# Patient Record
Sex: Female | Born: 1955 | Race: White | Hispanic: No | Marital: Married | Smoking: Current every day smoker
Health system: Southern US, Community
[De-identification: ages and names within clinical notes are randomized; demographics above are authoritative.]

## PROBLEM LIST (undated history)

## (undated) DIAGNOSIS — E119 Type 2 diabetes mellitus without complications: Secondary | ICD-10-CM

## (undated) DIAGNOSIS — I509 Heart failure, unspecified: Secondary | ICD-10-CM

## (undated) DIAGNOSIS — J449 Chronic obstructive pulmonary disease, unspecified: Secondary | ICD-10-CM

## (undated) DIAGNOSIS — I10 Essential (primary) hypertension: Secondary | ICD-10-CM

## (undated) HISTORY — PX: ABDOMINAL HYSTERECTOMY: SHX81

## (undated) HISTORY — PX: THROAT SURGERY: SHX803

---

## 2018-08-31 ENCOUNTER — Inpatient Hospital Stay
Admission: EM | Admit: 2018-08-31 | Discharge: 2018-09-24 | DRG: 207 | Disposition: E | Payer: Self-pay | Attending: Internal Medicine | Admitting: Internal Medicine

## 2018-08-31 DIAGNOSIS — R402432 Glasgow coma scale score 3-8, at arrival to emergency department: Secondary | ICD-10-CM | POA: Diagnosis present

## 2018-08-31 DIAGNOSIS — R092 Respiratory arrest: Secondary | ICD-10-CM

## 2018-08-31 DIAGNOSIS — Z8249 Family history of ischemic heart disease and other diseases of the circulatory system: Secondary | ICD-10-CM

## 2018-08-31 DIAGNOSIS — J9690 Respiratory failure, unspecified, unspecified whether with hypoxia or hypercapnia: Secondary | ICD-10-CM

## 2018-08-31 DIAGNOSIS — E669 Obesity, unspecified: Secondary | ICD-10-CM | POA: Diagnosis present

## 2018-08-31 DIAGNOSIS — Z880 Allergy status to penicillin: Secondary | ICD-10-CM

## 2018-08-31 DIAGNOSIS — E875 Hyperkalemia: Secondary | ICD-10-CM | POA: Diagnosis present

## 2018-08-31 DIAGNOSIS — I110 Hypertensive heart disease with heart failure: Secondary | ICD-10-CM | POA: Diagnosis present

## 2018-08-31 DIAGNOSIS — Z833 Family history of diabetes mellitus: Secondary | ICD-10-CM

## 2018-08-31 DIAGNOSIS — Z515 Encounter for palliative care: Secondary | ICD-10-CM | POA: Diagnosis not present

## 2018-08-31 DIAGNOSIS — D696 Thrombocytopenia, unspecified: Secondary | ICD-10-CM | POA: Diagnosis present

## 2018-08-31 DIAGNOSIS — N289 Disorder of kidney and ureter, unspecified: Secondary | ICD-10-CM | POA: Diagnosis present

## 2018-08-31 DIAGNOSIS — G931 Anoxic brain damage, not elsewhere classified: Secondary | ICD-10-CM | POA: Diagnosis present

## 2018-08-31 DIAGNOSIS — I428 Other cardiomyopathies: Secondary | ICD-10-CM | POA: Diagnosis present

## 2018-08-31 DIAGNOSIS — Z853 Personal history of malignant neoplasm of breast: Secondary | ICD-10-CM

## 2018-08-31 DIAGNOSIS — I469 Cardiac arrest, cause unspecified: Secondary | ICD-10-CM | POA: Diagnosis present

## 2018-08-31 DIAGNOSIS — G936 Cerebral edema: Secondary | ICD-10-CM | POA: Diagnosis present

## 2018-08-31 DIAGNOSIS — Z0189 Encounter for other specified special examinations: Secondary | ICD-10-CM

## 2018-08-31 DIAGNOSIS — Z8241 Family history of sudden cardiac death: Secondary | ICD-10-CM

## 2018-08-31 DIAGNOSIS — J690 Pneumonitis due to inhalation of food and vomit: Secondary | ICD-10-CM | POA: Diagnosis present

## 2018-08-31 DIAGNOSIS — R197 Diarrhea, unspecified: Secondary | ICD-10-CM | POA: Diagnosis present

## 2018-08-31 DIAGNOSIS — A419 Sepsis, unspecified organism: Secondary | ICD-10-CM | POA: Diagnosis present

## 2018-08-31 DIAGNOSIS — Z9911 Dependence on respirator [ventilator] status: Secondary | ICD-10-CM

## 2018-08-31 DIAGNOSIS — I2510 Atherosclerotic heart disease of native coronary artery without angina pectoris: Secondary | ICD-10-CM | POA: Diagnosis present

## 2018-08-31 DIAGNOSIS — E785 Hyperlipidemia, unspecified: Secondary | ICD-10-CM | POA: Diagnosis present

## 2018-08-31 DIAGNOSIS — J441 Chronic obstructive pulmonary disease with (acute) exacerbation: Secondary | ICD-10-CM | POA: Diagnosis present

## 2018-08-31 DIAGNOSIS — G253 Myoclonus: Secondary | ICD-10-CM | POA: Diagnosis not present

## 2018-08-31 DIAGNOSIS — R6521 Severe sepsis with septic shock: Secondary | ICD-10-CM | POA: Diagnosis present

## 2018-08-31 DIAGNOSIS — Z7984 Long term (current) use of oral hypoglycemic drugs: Secondary | ICD-10-CM

## 2018-08-31 DIAGNOSIS — J9601 Acute respiratory failure with hypoxia: Principal | ICD-10-CM | POA: Diagnosis present

## 2018-08-31 DIAGNOSIS — Z7982 Long term (current) use of aspirin: Secondary | ICD-10-CM

## 2018-08-31 DIAGNOSIS — R6520 Severe sepsis without septic shock: Secondary | ICD-10-CM | POA: Diagnosis present

## 2018-08-31 DIAGNOSIS — J9602 Acute respiratory failure with hypercapnia: Secondary | ICD-10-CM | POA: Diagnosis present

## 2018-08-31 DIAGNOSIS — J189 Pneumonia, unspecified organism: Secondary | ICD-10-CM

## 2018-08-31 DIAGNOSIS — F17210 Nicotine dependence, cigarettes, uncomplicated: Secondary | ICD-10-CM | POA: Diagnosis present

## 2018-08-31 DIAGNOSIS — Z6841 Body Mass Index (BMI) 40.0 and over, adult: Secondary | ICD-10-CM

## 2018-08-31 DIAGNOSIS — E1165 Type 2 diabetes mellitus with hyperglycemia: Secondary | ICD-10-CM | POA: Diagnosis present

## 2018-08-31 DIAGNOSIS — E119 Type 2 diabetes mellitus without complications: Secondary | ICD-10-CM

## 2018-08-31 DIAGNOSIS — I10 Essential (primary) hypertension: Secondary | ICD-10-CM | POA: Diagnosis present

## 2018-08-31 DIAGNOSIS — E781 Pure hyperglyceridemia: Secondary | ICD-10-CM | POA: Diagnosis present

## 2018-08-31 DIAGNOSIS — R570 Cardiogenic shock: Secondary | ICD-10-CM | POA: Diagnosis present

## 2018-08-31 DIAGNOSIS — Z66 Do not resuscitate: Secondary | ICD-10-CM | POA: Diagnosis not present

## 2018-08-31 HISTORY — DX: Chronic obstructive pulmonary disease, unspecified: J44.9

## 2018-08-31 HISTORY — DX: Heart failure, unspecified: I50.9

## 2018-08-31 HISTORY — DX: Type 2 diabetes mellitus without complications: E11.9

## 2018-08-31 HISTORY — DX: Essential (primary) hypertension: I10

## 2018-08-31 MED ORDER — ETOMIDATE 2 MG/ML IV SOLN
30.0000 mg | Freq: Once | INTRAVENOUS | Status: AC
  Administered 2018-09-01: 30 mg via INTRAVENOUS

## 2018-08-31 MED ORDER — ROCURONIUM BROMIDE 50 MG/5ML IV SOLN
120.0000 mg | Freq: Once | INTRAVENOUS | Status: AC
  Administered 2018-09-01: 120 mg via INTRAVENOUS
  Filled 2018-08-31: qty 12

## 2018-08-31 NOTE — ED Triage Notes (Addendum)
Patient coming [facility] from home. Call initially for respiratory distress. Upon EMS arrival they were informed that patient was assisted to floor after losing consciousness - patient unresponsive at EMS arrival.   Initial rhythm asystole. Patient given 2 epi's and 20 minutes of CPR. ROSC returned.   Patient's BP intinially 40/20. Dopamine initiated. Following BPs were 90/50, and 150/114.  [facility] airway on arrival

## 2018-09-01 ENCOUNTER — Inpatient Hospital Stay (HOSPITAL_COMMUNITY)
Admit: 2018-09-01 | Discharge: 2018-09-01 | Disposition: A | Discharge status: Home / Self Care | Payer: Self-pay | Attending: Adult Health | Admitting: Adult Health

## 2018-09-01 ENCOUNTER — Emergency Department: Payer: Self-pay

## 2018-09-01 ENCOUNTER — Inpatient Hospital Stay: Payer: Self-pay

## 2018-09-01 ENCOUNTER — Other Ambulatory Visit: Payer: Self-pay

## 2018-09-01 DIAGNOSIS — I469 Cardiac arrest, cause unspecified: Secondary | ICD-10-CM | POA: Diagnosis present

## 2018-09-01 DIAGNOSIS — A419 Sepsis, unspecified organism: Secondary | ICD-10-CM | POA: Diagnosis present

## 2018-09-01 DIAGNOSIS — I10 Essential (primary) hypertension: Secondary | ICD-10-CM | POA: Diagnosis present

## 2018-09-01 DIAGNOSIS — J9602 Acute respiratory failure with hypercapnia: Secondary | ICD-10-CM

## 2018-09-01 DIAGNOSIS — J441 Chronic obstructive pulmonary disease with (acute) exacerbation: Secondary | ICD-10-CM | POA: Diagnosis present

## 2018-09-01 DIAGNOSIS — E119 Type 2 diabetes mellitus without complications: Secondary | ICD-10-CM

## 2018-09-01 DIAGNOSIS — J9601 Acute respiratory failure with hypoxia: Secondary | ICD-10-CM | POA: Diagnosis present

## 2018-09-01 DIAGNOSIS — R6520 Severe sepsis without septic shock: Secondary | ICD-10-CM

## 2018-09-01 DIAGNOSIS — J690 Pneumonitis due to inhalation of food and vomit: Secondary | ICD-10-CM | POA: Diagnosis present

## 2018-09-01 LAB — GASTROINTESTINAL PANEL BY PCR, STOOL (REPLACES STOOL CULTURE)

## 2018-09-01 LAB — CBC WITH DIFFERENTIAL/PLATELET
Abs Immature Granulocytes: 1.05 10*3/uL — ABNORMAL HIGH (ref 0.00–0.07)
Basophils Absolute: 0.1 10*3/uL (ref 0.0–0.1)
Basophils Relative: 1 %
Eosinophils Absolute: 0.3 10*3/uL (ref 0.0–0.5)
Eosinophils Relative: 2 %
HCT: 46.9 % — ABNORMAL HIGH (ref 36.0–46.0)
Hemoglobin: 14.8 g/dL (ref 12.0–15.0)
Immature Granulocytes: 6 %
Lymphocytes Relative: 52 %
Lymphs Abs: 9.2 10*3/uL — ABNORMAL HIGH (ref 0.7–4.0)
MCH: 32 pg (ref 26.0–34.0)
MCHC: 31.6 g/dL (ref 30.0–36.0)
MCV: 101.5 fL — ABNORMAL HIGH (ref 80.0–100.0)
Monocytes Absolute: 0.6 10*3/uL (ref 0.1–1.0)
Monocytes Relative: 3 %
NEUTROS ABS: 6.3 10*3/uL (ref 1.7–7.7)
Neutrophils Relative %: 36 %
Platelets: 234 10*3/uL (ref 150–400)
RBC: 4.62 MIL/uL (ref 3.87–5.11)
RDW: 12.1 % (ref 11.5–15.5)
SMEAR REVIEW: UNDETERMINED
WBC: 17.5 10*3/uL — AB (ref 4.0–10.5)
nRBC: 0.2 % (ref 0.0–0.2)

## 2018-09-01 LAB — BLOOD GAS, ARTERIAL
Acid-base deficit: 4.8 mmol/L — ABNORMAL HIGH (ref 0.0–2.0)
Acid-base deficit: 7.4 mmol/L — ABNORMAL HIGH (ref 0.0–2.0)
Acid-base deficit: 7.4 mmol/L — ABNORMAL HIGH (ref 0.0–2.0)
BICARBONATE: 24 mmol/L (ref 20.0–28.0)
BICARBONATE: 20.5 mmol/L (ref 20.0–28.0)
Bicarbonate: 21.9 mmol/L (ref 20.0–28.0)
FIO2: 0.5
FIO2: 0.4
FIO2: 0.3
LHR: 22 {breaths}/min
MECHVT: 550 mL
MECHVT: 550 mL
MECHVT: 450 mL
Mechanical Rate: 16
Mechanical Rate: 22
O2 SAT: 88 %
O2 Saturation: 99.4 %
O2 Saturation: 93.2 %
PEEP/CPAP: 5 cmH2O
PEEP: 5 cmH2O
PEEP: 5 cmH2O
Patient temperature: 37
Patient temperature: 34.4
Patient temperature: 34
RATE: 16 resp/min
pCO2 arterial: 60 mmHg — ABNORMAL HIGH (ref 32.0–48.0)
pCO2 arterial: 60 mmHg — ABNORMAL HIGH (ref 32.0–48.0)
pCO2 arterial: 50 mmHg — ABNORMAL HIGH (ref 32.0–48.0)
pH, Arterial: 7.21 — ABNORMAL LOW (ref 7.350–7.450)
pH, Arterial: 7.2 — ABNORMAL LOW (ref 7.350–7.450)
pH, Arterial: 7.26 — ABNORMAL LOW (ref 7.350–7.450)
pO2, Arterial: 179 mmHg — ABNORMAL HIGH (ref 83.0–108.0)
pO2, Arterial: 72 mmHg — ABNORMAL LOW (ref 83.0–108.0)
pO2, Arterial: 54 mmHg — ABNORMAL LOW (ref 83.0–108.0)

## 2018-09-01 LAB — HEMOGLOBIN A1C
Hgb A1c MFr Bld: 7.1 % — ABNORMAL HIGH (ref 4.8–5.6)
Mean Plasma Glucose: 157.07 mg/dL

## 2018-09-01 LAB — BASIC METABOLIC PANEL
Anion gap: 5 (ref 5–15)
BUN: 16 mg/dL (ref 6–20)
CO2: 24 mmol/L (ref 22–32)
Calcium: 8.6 mg/dL — ABNORMAL LOW (ref 8.9–10.3)
Chloride: 109 mmol/L (ref 98–111)
Creatinine, Ser: 0.93 mg/dL (ref 0.44–1.00)
GFR calc Af Amer: >60 mL/min (ref >60)
GFR calc non Af Amer: >60 mL/min (ref >60)
Glucose, Bld: 399 mg/dL — ABNORMAL HIGH (ref 70–99)
Potassium: 5.2 mmol/L — ABNORMAL HIGH (ref 3.5–5.1)
Sodium: 138 mmol/L (ref 135–145)

## 2018-09-01 LAB — TROPONIN I
Troponin I: 0.09 ng/mL (ref <0.03)
Troponin I: 0.41 ng/mL (ref <0.03)
Troponin I: 0.41 ng/mL (ref <0.03)
Troponin I: 0.45 ng/mL (ref <0.03)

## 2018-09-01 LAB — URINALYSIS, COMPLETE (UACMP) WITH MICROSCOPIC
Bilirubin Urine: NEGATIVE
Glucose, UA: >=500 mg/dL — AB
Ketones, ur: NEGATIVE mg/dL
Leukocytes,Ua: NEGATIVE
NITRITE: NEGATIVE
Specific Gravity, Urine: 1.015 (ref 1.005–1.030)
pH: 6 (ref 5.0–8.0)

## 2018-09-01 LAB — BASIC METABOLIC PANEL WITH GFR
Anion gap: 7 (ref 5–15)
BUN: 21 mg/dL — ABNORMAL HIGH (ref 6–20)
CO2: 20 mmol/L — ABNORMAL LOW (ref 22–32)
Calcium: 8.4 mg/dL — ABNORMAL LOW (ref 8.9–10.3)
Chloride: 117 mmol/L — ABNORMAL HIGH (ref 98–111)
Creatinine, Ser: 0.67 mg/dL (ref 0.44–1.00)
GFR calc Af Amer: >60 mL/min (ref >60)
GFR calc non Af Amer: >60 mL/min (ref >60)
Glucose, Bld: 147 mg/dL — ABNORMAL HIGH (ref 70–99)
Potassium: 3.6 mmol/L (ref 3.5–5.1)
Sodium: 144 mmol/L (ref 135–145)

## 2018-09-01 LAB — URINALYSIS, ROUTINE W REFLEX MICROSCOPIC
Bacteria, UA: NONE SEEN
Bilirubin Urine: NEGATIVE
Glucose, UA: NEGATIVE mg/dL
Ketones, ur: NEGATIVE mg/dL
Leukocytes,Ua: NEGATIVE
Nitrite: NEGATIVE
Protein, ur: 30 mg/dL — AB
Specific Gravity, Urine: 1.034 — ABNORMAL HIGH (ref 1.005–1.030)
pH: 5 (ref 5.0–8.0)

## 2018-09-01 LAB — URINE DRUG SCREEN, QUALITATIVE (ARMC ONLY)
Amphetamines, Ur Screen: NOT DETECTED
Barbiturates, Ur Screen: NOT DETECTED
Benzodiazepine, Ur Scrn: NOT DETECTED
Cannabinoid 50 Ng, Ur ~~LOC~~: NOT DETECTED
Cocaine Metabolite,Ur ~~LOC~~: NOT DETECTED
MDMA (ECSTASY) UR SCREEN: NOT DETECTED
Methadone Scn, Ur: NOT DETECTED
Opiate, Ur Screen: NOT DETECTED
Phencyclidine (PCP) Ur S: NOT DETECTED
Tricyclic, Ur Screen: NOT DETECTED

## 2018-09-01 LAB — COMPREHENSIVE METABOLIC PANEL
ALBUMIN: 3.7 g/dL (ref 3.5–5.0)
ALT: 84 U/L — ABNORMAL HIGH (ref 0–44)
ANION GAP: 15 (ref 5–15)
AST: 135 U/L — ABNORMAL HIGH (ref 15–41)
Alkaline Phosphatase: 79 U/L (ref 38–126)
BUN: 11 mg/dL (ref 6–20)
CO2: 18 mmol/L — ABNORMAL LOW (ref 22–32)
Calcium: 8.7 mg/dL — ABNORMAL LOW (ref 8.9–10.3)
Chloride: 108 mmol/L (ref 98–111)
Creatinine, Ser: 1.11 mg/dL — ABNORMAL HIGH (ref 0.44–1.00)
GFR calc Af Amer: >60 mL/min (ref >60)
GFR calc non Af Amer: 54 mL/min — ABNORMAL LOW (ref >60)
GLUCOSE: 307 mg/dL — AB (ref 70–99)
POTASSIUM: 5.7 mmol/L — AB (ref 3.5–5.1)
Sodium: 141 mmol/L (ref 135–145)
Total Bilirubin: 0.6 mg/dL (ref 0.3–1.2)
Total Protein: 6.1 g/dL — ABNORMAL LOW (ref 6.5–8.1)

## 2018-09-01 LAB — GLUCOSE, CAPILLARY
GLUCOSE-CAPILLARY: 276 mg/dL — AB (ref 70–99)
GLUCOSE-CAPILLARY: 147 mg/dL — AB (ref 70–99)
Glucose-Capillary: 284 mg/dL — ABNORMAL HIGH (ref 70–99)
Glucose-Capillary: 381 mg/dL — ABNORMAL HIGH (ref 70–99)
Glucose-Capillary: 367 mg/dL — ABNORMAL HIGH (ref 70–99)
Glucose-Capillary: 318 mg/dL — ABNORMAL HIGH (ref 70–99)
Glucose-Capillary: 246 mg/dL — ABNORMAL HIGH (ref 70–99)
Glucose-Capillary: 209 mg/dL — ABNORMAL HIGH (ref 70–99)
Glucose-Capillary: 175 mg/dL — ABNORMAL HIGH (ref 70–99)
Glucose-Capillary: 159 mg/dL — ABNORMAL HIGH (ref 70–99)
Glucose-Capillary: 138 mg/dL — ABNORMAL HIGH (ref 70–99)
Glucose-Capillary: 133 mg/dL — ABNORMAL HIGH (ref 70–99)
Glucose-Capillary: 137 mg/dL — ABNORMAL HIGH (ref 70–99)
Glucose-Capillary: 127 mg/dL — ABNORMAL HIGH (ref 70–99)
Glucose-Capillary: 159 mg/dL — ABNORMAL HIGH (ref 70–99)
Glucose-Capillary: 150 mg/dL — ABNORMAL HIGH (ref 70–99)
Glucose-Capillary: 158 mg/dL — ABNORMAL HIGH (ref 70–99)

## 2018-09-01 LAB — MAGNESIUM
MAGNESIUM: 1.6 mg/dL — AB (ref 1.7–2.4)
Magnesium: 2.2 mg/dL (ref 1.7–2.4)

## 2018-09-01 LAB — CBC
HCT: 41.9 % (ref 36.0–46.0)
Hemoglobin: 13.9 g/dL (ref 12.0–15.0)
MCH: 32.5 pg (ref 26.0–34.0)
MCHC: 33.2 g/dL (ref 30.0–36.0)
MCV: 97.9 fL (ref 80.0–100.0)
Platelets: 243 K/uL (ref 150–400)
RBC: 4.28 MIL/uL (ref 3.87–5.11)
RDW: 12.3 % (ref 11.5–15.5)
WBC: 39.5 K/uL — ABNORMAL HIGH (ref 4.0–10.5)
nRBC: 0.1 % (ref 0.0–0.2)

## 2018-09-01 LAB — PROCALCITONIN: Procalcitonin: 5.84 ng/mL

## 2018-09-01 LAB — ECHOCARDIOGRAM COMPLETE
Height: 62 in
Weight: 3470.92 oz

## 2018-09-01 LAB — INFLUENZA PANEL BY PCR (TYPE A & B)
Influenza A By PCR: NEGATIVE
Influenza B By PCR: NEGATIVE

## 2018-09-01 LAB — BLOOD GAS, VENOUS
FIO2: 0.6
MECHVT: 550 mL
PEEP/CPAP: 5 cmH2O
PO2 VEN: 130 mmHg — AB (ref 32.0–45.0)
Patient temperature: 37
RATE: 10 resp/min
pCO2, Ven: 119 mmHg (ref 44.0–60.0)
pH, Ven: <6.9 — CL (ref 7.250–7.430)

## 2018-09-01 LAB — APTT: aPTT: 37 seconds — ABNORMAL HIGH (ref 24–36)

## 2018-09-01 LAB — LACTIC ACID, PLASMA
Lactic Acid, Venous: 2.3 mmol/L (ref 0.5–1.9)
Lactic Acid, Venous: 6.6 mmol/L (ref 0.5–1.9)
Lactic Acid, Venous: >11 mmol/L (ref 0.5–1.9)

## 2018-09-01 LAB — PROTIME-INR
INR: 1.2 (ref 0.8–1.2)
Prothrombin Time: 15.3 s — ABNORMAL HIGH (ref 11.4–15.2)

## 2018-09-01 LAB — PHOSPHORUS
Phosphorus: 5.4 mg/dL — ABNORMAL HIGH (ref 2.5–4.6)
Phosphorus: 3.2 mg/dL (ref 2.5–4.6)

## 2018-09-01 LAB — C DIFFICILE QUICK SCREEN W PCR REFLEX
C Diff antigen: NEGATIVE
C Diff interpretation: NOT DETECTED
C Diff toxin: NEGATIVE

## 2018-09-01 LAB — MRSA PCR SCREENING: MRSA by PCR: NEGATIVE

## 2018-09-01 MED ORDER — SODIUM CHLORIDE 0.9 % IV SOLN
1.0000 g | Freq: Once | INTRAVENOUS | Status: AC
  Administered 2018-09-01: 1 g via INTRAVENOUS
  Filled 2018-09-01: qty 1

## 2018-09-01 MED ORDER — IPRATROPIUM-ALBUTEROL 0.5-2.5 (3) MG/3ML IN SOLN: 3.0000 mL | RESPIRATORY_TRACT | Status: DC | PRN

## 2018-09-01 MED ORDER — SODIUM CHLORIDE 0.9 % IV SOLN
INTRAVENOUS | Status: DC
  Administered 2018-09-01 (×2): via INTRAVENOUS

## 2018-09-01 MED ORDER — ONDANSETRON HCL 4 MG/2ML IJ SOLN: 4.0000 mg | Freq: Four times a day (QID) | INTRAMUSCULAR | Status: DC | PRN

## 2018-09-01 MED ORDER — ASPIRIN 300 MG RE SUPP: 300.0000 mg | RECTAL | Status: DC

## 2018-09-01 MED ORDER — MIDAZOLAM 50MG/50ML (1MG/ML) PREMIX INFUSION
0.0000 mg/h | INTRAVENOUS | Status: DC
  Filled 2018-09-01: qty 50

## 2018-09-01 MED ORDER — ORAL CARE MOUTH RINSE
15.0000 mL | OROMUCOSAL | Status: DC
  Administered 2018-09-01 – 2018-09-05 (×37): 15 mL via OROMUCOSAL

## 2018-09-01 MED ORDER — DEXTROSE 50 % IV SOLN
INTRAVENOUS | Status: AC
  Administered 2018-09-01: 25 mL via INTRAVENOUS
  Filled 2018-09-01: qty 50

## 2018-09-01 MED ORDER — VANCOMYCIN HCL IN DEXTROSE 1-5 GM/200ML-% IV SOLN
1000.0000 mg | INTRAVENOUS | Status: DC
  Administered 2018-09-01: 1000 mg via INTRAVENOUS
  Filled 2018-09-01: qty 200

## 2018-09-01 MED ORDER — FENTANYL 2500MCG IN NS 250ML (10MCG/ML) PREMIX INFUSION
25.0000 ug/h | INTRAVENOUS | Status: DC
  Administered 2018-09-01: 50 ug/h via INTRAVENOUS
  Administered 2018-09-02: 125 ug/h via INTRAVENOUS
  Administered 2018-09-02 – 2018-09-03 (×2): 25 ug/h via INTRAVENOUS
  Filled 2018-09-01 (×4): qty 250

## 2018-09-01 MED ORDER — BUDESONIDE 0.5 MG/2ML IN SUSP
0.5000 mg | Freq: Two times a day (BID) | RESPIRATORY_TRACT | Status: DC
  Administered 2018-09-01 – 2018-09-03 (×5): 0.5 mg via RESPIRATORY_TRACT
  Filled 2018-09-01 (×5): qty 2

## 2018-09-01 MED ORDER — ACETAMINOPHEN 325 MG PO TABS: 650.0000 mg | ORAL_TABLET | ORAL | Status: DC | PRN

## 2018-09-01 MED ORDER — NOREPINEPHRINE 16 MG/250ML-% IV SOLN
0.0000 ug/min | INTRAVENOUS | Status: DC
  Administered 2018-09-01: 5 ug/min via INTRAVENOUS
  Filled 2018-09-01: qty 250

## 2018-09-01 MED ORDER — SODIUM CHLORIDE 0.9 % IV BOLUS
1000.0000 mL | Freq: Once | INTRAVENOUS | Status: AC
  Administered 2018-09-01: 1000 mL via INTRAVENOUS

## 2018-09-01 MED ORDER — METHYLPREDNISOLONE SODIUM SUCC 125 MG IJ SOLR
125.0000 mg | Freq: Once | INTRAMUSCULAR | Status: AC
  Administered 2018-09-01: 125 mg via INTRAVENOUS
  Filled 2018-09-01: qty 2

## 2018-09-01 MED ORDER — CALCIUM CHLORIDE 10 % IV SOLN
1.0000 g | Freq: Once | INTRAVENOUS | Status: AC
  Administered 2018-09-01: 1 g via INTRAVENOUS

## 2018-09-01 MED ORDER — METRONIDAZOLE IN NACL 5-0.79 MG/ML-% IV SOLN
500.0000 mg | Freq: Three times a day (TID) | INTRAVENOUS | Status: DC
  Administered 2018-09-01 – 2018-09-03 (×8): 500 mg via INTRAVENOUS
  Filled 2018-09-01 (×11): qty 100

## 2018-09-01 MED ORDER — MIDAZOLAM BOLUS VIA INFUSION
2.0000 mg | INTRAVENOUS | Status: DC | PRN
  Administered 2018-09-01: 4 mg via INTRAVENOUS
  Filled 2018-09-01: qty 4

## 2018-09-01 MED ORDER — ORAL CARE MOUTH RINSE
15.0000 mL | OROMUCOSAL | Status: DC
  Administered 2018-09-01: 15 mL via OROMUCOSAL

## 2018-09-01 MED ORDER — DOPAMINE-DEXTROSE 3.2-5 MG/ML-% IV SOLN
INTRAVENOUS | Status: AC
  Filled 2018-09-01: qty 250

## 2018-09-01 MED ORDER — FUROSEMIDE 10 MG/ML IJ SOLN
20.0000 mg | Freq: Once | INTRAMUSCULAR | Status: AC
  Administered 2018-09-01: 20 mg via INTRAVENOUS
  Filled 2018-09-01: qty 4

## 2018-09-01 MED ORDER — INSULIN ASPART 100 UNIT/ML ~~LOC~~ SOLN
10.0000 [IU] | Freq: Once | SUBCUTANEOUS | Status: AC
  Administered 2018-09-01: 10 [IU] via INTRAVENOUS
  Filled 2018-09-01: qty 1

## 2018-09-01 MED ORDER — HEPARIN BOLUS VIA INFUSION
4000.0000 [IU] | Freq: Once | INTRAVENOUS | Status: AC
  Administered 2018-09-01: 4000 [IU] via INTRAVENOUS
  Filled 2018-09-01: qty 4000

## 2018-09-01 MED ORDER — CLINDAMYCIN PHOSPHATE 600 MG/50ML IV SOLN
600.0000 mg | Freq: Once | INTRAVENOUS | Status: AC
  Administered 2018-09-01: 600 mg via INTRAVENOUS
  Filled 2018-09-01: qty 50

## 2018-09-01 MED ORDER — DEXTROSE 50 % IV SOLN
25.0000 mL | Freq: Once | INTRAVENOUS | Status: AC
  Administered 2018-09-01 (×2): 25 mL via INTRAVENOUS
  Filled 2018-09-01: qty 50

## 2018-09-01 MED ORDER — METHYLPREDNISOLONE SODIUM SUCC 40 MG IJ SOLR
40.0000 mg | Freq: Two times a day (BID) | INTRAMUSCULAR | Status: DC
  Administered 2018-09-01 – 2018-09-02 (×2): 40 mg via INTRAVENOUS
  Filled 2018-09-01 (×2): qty 1

## 2018-09-01 MED ORDER — ARTIFICIAL TEARS OPHTHALMIC OINT
TOPICAL_OINTMENT | OPHTHALMIC | Status: DC | PRN
  Administered 2018-09-01: 1 via OPHTHALMIC
  Administered 2018-09-03 – 2018-09-04 (×3): via OPHTHALMIC
  Filled 2018-09-01 (×2): qty 3.5

## 2018-09-01 MED ORDER — SODIUM CHLORIDE 0.9 % IV SOLN
0.0000 mg/h | INTRAVENOUS | Status: DC
  Administered 2018-09-01: 2 mg/h via INTRAVENOUS
  Administered 2018-09-01: 8.5 mg/h via INTRAVENOUS
  Administered 2018-09-01: 5.5 mg/h via INTRAVENOUS
  Administered 2018-09-02: 8.5 mg/h via INTRAVENOUS
  Filled 2018-09-01 (×6): qty 10

## 2018-09-01 MED ORDER — INSULIN ASPART 100 UNIT/ML ~~LOC~~ SOLN: 0.0000 [IU] | SUBCUTANEOUS | Status: DC

## 2018-09-01 MED ORDER — NOREPINEPHRINE 4 MG/250ML-% IV SOLN
0.0000 ug/min | INTRAVENOUS | Status: DC
  Administered 2018-09-01: 5 ug/min via INTRAVENOUS
  Administered 2018-09-01: 6 ug/min via INTRAVENOUS
  Filled 2018-09-01 (×2): qty 250

## 2018-09-01 MED ORDER — ENOXAPARIN SODIUM 40 MG/0.4ML ~~LOC~~ SOLN: 40.0000 mg | SUBCUTANEOUS | Status: DC

## 2018-09-01 MED ORDER — IPRATROPIUM-ALBUTEROL 0.5-2.5 (3) MG/3ML IN SOLN
3.0000 mL | Freq: Once | RESPIRATORY_TRACT | Status: AC
  Administered 2018-09-01: 3 mL via RESPIRATORY_TRACT
  Filled 2018-09-01: qty 3

## 2018-09-01 MED ORDER — FAMOTIDINE IN NACL 20-0.9 MG/50ML-% IV SOLN
20.0000 mg | Freq: Two times a day (BID) | INTRAVENOUS | Status: DC
  Administered 2018-09-01 – 2018-09-03 (×5): 20 mg via INTRAVENOUS
  Filled 2018-09-01 (×5): qty 50

## 2018-09-01 MED ORDER — DEXMEDETOMIDINE HCL IN NACL 400 MCG/100ML IV SOLN
0.4000 ug/kg/h | INTRAVENOUS | Status: DC
  Administered 2018-09-01: 0.4 ug/kg/h via INTRAVENOUS
  Filled 2018-09-01: qty 100

## 2018-09-01 MED ORDER — INSULIN DETEMIR 100 UNIT/ML ~~LOC~~ SOLN
12.0000 [IU] | Freq: Two times a day (BID) | SUBCUTANEOUS | Status: DC
  Administered 2018-09-01 – 2018-09-02 (×2): 12 [IU] via SUBCUTANEOUS
  Filled 2018-09-01 (×3): qty 0.12

## 2018-09-01 MED ORDER — IPRATROPIUM-ALBUTEROL 0.5-2.5 (3) MG/3ML IN SOLN
3.0000 mL | Freq: Four times a day (QID) | RESPIRATORY_TRACT | Status: DC
  Administered 2018-09-01 – 2018-09-03 (×9): 3 mL via RESPIRATORY_TRACT
  Filled 2018-09-01 (×9): qty 3

## 2018-09-01 MED ORDER — SODIUM CHLORIDE 0.9% FLUSH: 10.0000 mL | INTRAVENOUS | Status: DC | PRN

## 2018-09-01 MED ORDER — VANCOMYCIN HCL IN DEXTROSE 1-5 GM/200ML-% IV SOLN
1000.0000 mg | Freq: Once | INTRAVENOUS | Status: AC
  Administered 2018-09-01: 1000 mg via INTRAVENOUS
  Filled 2018-09-01: qty 200

## 2018-09-01 MED ORDER — INSULIN REGULAR(HUMAN) IN NACL 100-0.9 UT/100ML-% IV SOLN
INTRAVENOUS | Status: DC
  Administered 2018-09-01: 3.2 [IU]/h via INTRAVENOUS

## 2018-09-01 MED ORDER — IOHEXOL 350 MG/ML SOLN
75.0000 mL | Freq: Once | INTRAVENOUS | Status: AC | PRN
  Administered 2018-09-01: 75 mL via INTRAVENOUS

## 2018-09-01 MED ORDER — LACTATED RINGERS IV SOLN
INTRAVENOUS | Status: DC
  Administered 2018-09-01 – 2018-09-02 (×2): via INTRAVENOUS

## 2018-09-01 MED ORDER — HEPARIN (PORCINE) 25000 UT/250ML-% IV SOLN
900.0000 [IU]/h | INTRAVENOUS | Status: DC
  Administered 2018-09-01: 900 [IU]/h via INTRAVENOUS
  Filled 2018-09-01: qty 250

## 2018-09-01 MED ORDER — HEPARIN SODIUM (PORCINE) 5000 UNIT/ML IJ SOLN
5000.0000 [IU] | Freq: Three times a day (TID) | INTRAMUSCULAR | Status: DC
  Administered 2018-09-01 – 2018-09-03 (×7): 5000 [IU] via SUBCUTANEOUS
  Filled 2018-09-01 (×7): qty 1

## 2018-09-01 MED ORDER — INSULIN ASPART 100 UNIT/ML ~~LOC~~ SOLN
3.0000 [IU] | SUBCUTANEOUS | Status: DC
  Administered 2018-09-01: 3 [IU] via SUBCUTANEOUS
  Administered 2018-09-02: 6 [IU] via SUBCUTANEOUS
  Administered 2018-09-02: 3 [IU] via SUBCUTANEOUS
  Administered 2018-09-02: 6 [IU] via SUBCUTANEOUS
  Filled 2018-09-01 (×4): qty 1

## 2018-09-01 MED ORDER — MIDAZOLAM HCL 2 MG/2ML IJ SOLN
2.0000 mg | INTRAMUSCULAR | Status: DC | PRN
  Administered 2018-09-01: 4 mg via INTRAVENOUS
  Administered 2018-09-01: 2 mg via INTRAVENOUS
  Administered 2018-09-01: 4 mg via INTRAVENOUS
  Filled 2018-09-01: qty 4
  Filled 2018-09-01: qty 2

## 2018-09-01 MED ORDER — SODIUM BICARBONATE 8.4 % IV SOLN
100.0000 meq | Freq: Once | INTRAVENOUS | Status: AC
  Administered 2018-09-01: 100 meq via INTRAVENOUS

## 2018-09-01 MED ORDER — CHLORHEXIDINE GLUCONATE 0.12% ORAL RINSE (MEDLINE KIT)
15.0000 mL | Freq: Two times a day (BID) | OROMUCOSAL | Status: DC
  Administered 2018-09-01 – 2018-09-04 (×8): 15 mL via OROMUCOSAL

## 2018-09-01 MED ORDER — MIDAZOLAM HCL 2 MG/2ML IJ SOLN
INTRAMUSCULAR | Status: AC
  Administered 2018-09-01: 2 mg via INTRAVENOUS
  Filled 2018-09-01: qty 4

## 2018-09-01 MED ORDER — METHYLPREDNISOLONE SODIUM SUCC 125 MG IJ SOLR
60.0000 mg | Freq: Four times a day (QID) | INTRAMUSCULAR | Status: DC
  Administered 2018-09-01: 60 mg via INTRAVENOUS
  Filled 2018-09-01: qty 2

## 2018-09-01 MED ORDER — CHLORHEXIDINE GLUCONATE 0.12% ORAL RINSE (MEDLINE KIT)
15.0000 mL | Freq: Two times a day (BID) | OROMUCOSAL | Status: DC
  Administered 2018-09-01: 15 mL via OROMUCOSAL

## 2018-09-01 MED ORDER — SODIUM CHLORIDE 0.9% FLUSH
10.0000 mL | Freq: Two times a day (BID) | INTRAVENOUS | Status: DC
  Administered 2018-09-01: 40 mL
  Administered 2018-09-01: 30 mL
  Administered 2018-09-02: 10 mL
  Administered 2018-09-02: 40 mL
  Administered 2018-09-03 – 2018-09-04 (×2): 10 mL
  Administered 2018-09-04 (×2): 30 mL
  Administered 2018-09-04: 08:00:00

## 2018-09-01 MED ORDER — IPRATROPIUM-ALBUTEROL 0.5-2.5 (3) MG/3ML IN SOLN
3.0000 mL | RESPIRATORY_TRACT | Status: DC
  Administered 2018-09-01: 3 mL via RESPIRATORY_TRACT
  Filled 2018-09-01: qty 3

## 2018-09-01 MED ORDER — SODIUM CHLORIDE 0.9 % IV SOLN
2.0000 g | Freq: Two times a day (BID) | INTRAVENOUS | Status: DC
  Administered 2018-09-01 – 2018-09-03 (×5): 2 g via INTRAVENOUS
  Filled 2018-09-01 (×7): qty 2

## 2018-09-01 MED ORDER — FENTANYL BOLUS VIA INFUSION
50.0000 ug | INTRAVENOUS | Status: DC | PRN
  Administered 2018-09-02: 50 ug via INTRAVENOUS
  Filled 2018-09-01: qty 50

## 2018-09-01 MED ORDER — FENTANYL CITRATE (PF) 100 MCG/2ML IJ SOLN: 50.0000 ug | Freq: Once | INTRAMUSCULAR | Status: DC

## 2018-09-01 NOTE — Consult Note (Signed)
ANTICOAGULATION CONSULT NOTE - Initial Consult  Pharmacy Consult for Heparin Infusion  Indication: chest pain/ACS  Allergies  Allergen Reactions  . Erythromycin   . Penicillins     Patient Measurements: Height: 5\' 2"  (157.5 cm) Weight: 220 lb (99.8 kg) IBW/kg (Calculated) : 50.1 Heparin Dosing Weight: 74 Kg  Vital Signs: Temp: 94.8 F (34.9 C) (03/09 0044) Temp Source: Rectal (03/09 0044) BP: 88/73 (03/09 0136) Pulse Rate: 90 (03/09 0136)  Labs: Recent Labs    Sep 17, 2018 2357  HGB 14.8  HCT 46.9*  PLT 234  CREATININE 1.11*  TROPONINI 0.09*    Estimated Creatinine Clearance: 59.6 mL/min (A) (by C-G formula based on SCr of 1.11 mg/dL (H)).   Medical History: Past Medical History:  Diagnosis Date  . CHF (congestive heart failure) (HCC)   . COPD (chronic obstructive pulmonary disease) (HCC)   . Diabetes mellitus without complication (HCC)   . Hypertension     Assessment: Pharmacy consulted for heparin drip dosing and monitoring for ACS/STEMI in 63 yo female admitted to ICU following cardiac arrest.   Goal of Therapy:  Heparin level 0.3-0.7 units/ml Monitor platelets by anticoagulation protocol: Yes   Plan:  Baseline labs ordered Dosing weight: 74 kg Give 4000 units bolus x 1 Start heparin infusion at 900 units/hr Check anti-Xa level in 6 hours and daily while on heparin Continue to monitor H&H and platelets  Gardner Candle, PharmD, BCPS Clinical Pharmacist 09/01/2018 3:37 AM

## 2018-09-01 NOTE — Consult Note (Signed)
Pharmacy Antibiotic Note  Suzanne Rocha is a 63 y.o. female admitted on 09/21/18 with sepsis.  Pharmacy has been consulted for Cefepime and Vancomycin dosing.  Patient received vancomycin 1g IV x 1 dose and Cefepime 2gm IV x 1 dose in ED.  Patient has a listed allergy to PCN, but patient tolerated cefepime dose in ED.   Plan: Start Vancomycin 1000 mg IV Q 24 hrs. Goal AUC 400-550. Expected AUC: 503 SCr used: 1.11  Start Cefepime 2g IV every 12 hours    Height: 5\' 2"  (157.5 cm) Weight: 220 lb (99.8 kg) IBW/kg (Calculated) : 50.1  Temp (24hrs), Avg:94.8 F (34.9 C), Min:94.8 F (34.9 C), Max:94.8 F (34.9 C)  Recent Labs  Lab 09-21-2018 2357 09/01/18 0203 09/01/18 0411  WBC 17.5*  --  39.5*  CREATININE 1.11*  --   --   LATICACIDVEN >11.0* 6.6*  --     Estimated Creatinine Clearance: 59.6 mL/min (A) (by C-G formula based on SCr of 1.11 mg/dL (H)).    Allergies  Allergen Reactions  . Erythromycin   . Penicillins Hives and Swelling    Antimicrobials this admission: 3/9 vancomycin >>  3/9 Cefepime>>   Dose adjustments this admission:  Microbiology results: 3/9 BCx: pending 3/9 MRSA PCR: pending  Thank you for allowing pharmacy to be a part of this patient's care.  Gardner Candle, PharmD, BCPS Clinical Pharmacist 09/01/2018 4:57 AM

## 2018-09-01 NOTE — Progress Notes (Signed)
[facility] [place] [facility]   PATIENT NAME: Suzanne Rocha    MR#:  [id number]  DATE OF BIRTH:  [birth year]  SUBJECTIVE:  CHIEF COMPLAINT:   Chief Complaint  Patient presents with  . Respiratory Arrest   Patient admitted to the ICU following cardiac arrest.  Currently sedated on the vent.  Requiring pressors.  No family members present at this time.  REVIEW OF SYSTEMS:  ROS Unobtainable due to patient being on the vent. DRUG ALLERGIES:   Allergies  Allergen Reactions  . Clindamycin Hives and Swelling  . Macrolides And Ketolides Anaphylaxis  . Erythromycin   . Penicillins Hives and Swelling   VITALS:  Blood pressure 93/77, pulse (!) 106, temperature 97.7 F (36.5 C), temperature source Esophageal, resp. rate (!) 22, height 5\' 2"  (1.575 m), weight 98.4 kg, SpO2 96 %. PHYSICAL EXAMINATION:  Physical Exam  Constitutional: She appears well-developed.  Patient sedated on the vent  HENT:  Head: Normocephalic.  Mouth/Throat: Oropharynx is clear and moist.  Eyes: Pupils are equal, round, and reactive to light. Conjunctivae are normal. Right eye exhibits no discharge.  Neck: Neck supple. No tracheal deviation present.  Cardiovascular: Normal rate, regular rhythm and normal heart sounds.  Respiratory: No stridor. No respiratory distress.  Sedated on the vent  GI: Soft. Bowel sounds are normal. She exhibits no distension.  Musculoskeletal:        General: No deformity or edema.  Neurological:  Sedated on vent.  Neuro examination deferred  Skin: Skin is warm. No erythema.  Psychiatric:  Sedated on the vent.  Psych evaluation deferred   LABORATORY PANEL:  Female CBC Recent Labs  Lab 09/01/18 0411  WBC 39.5*  HGB 13.9  HCT 41.9  PLT 243   ------------------------------------------------------------------------------------------------------------------ Chemistries  Recent Labs  Lab 09-20-18 2357 09/01/18 0411  NA 141 138  K 5.7* 5.2*  CL  108 109  CO2 18* 24  GLUCOSE 307* 399*  BUN 11 16  CREATININE 1.11* 0.93  CALCIUM 8.7* 8.6*  MG  --  2.2  AST 135*  --   ALT 84*  --   ALKPHOS 79  --   BILITOT 0.6  --    RADIOLOGY:  Dg Abdomen 1 View  Result Date: 09/01/2018 CLINICAL DATA:  OG tube placement. EXAM: ABDOMEN - 1 VIEW COMPARISON:  None. FINDINGS: Enteric tube tip is in the left upper quadrant consistent with location in the upper stomach. Gas-filled nondistended transverse colon. IMPRESSION: Enteric tube tip is in the left upper quadrant consistent with location in the upper stomach. Electronically Signed   By: Burman Nieves M.D.   On: 09/01/2018 00:50   Ct Head Wo Contrast  Result Date: 09/01/2018 CLINICAL DATA:  Patient loss consciousness and fell to the floor. Unresponsive upon EMS arrival. EXAM: CT HEAD WITHOUT CONTRAST TECHNIQUE: Contiguous axial images were obtained from the base of the skull through the vertex without intravenous contrast. COMPARISON:  None. FINDINGS: Brain: No evidence of acute infarction, hemorrhage, hydrocephalus, extra-axial collection or mass lesion/mass effect. Vascular: No hyperdense vessel or unexpected calcification. Skull: Calvarium appears intact. Sinuses/Orbits: Mild mucosal thickening in the paranasal sinuses. No acute air-fluid levels. Mastoid air cells are clear. Other: None. IMPRESSION: No acute intracranial abnormalities. Electronically Signed   By: Burman Nieves M.D.   On: 09/01/2018 01:28   Ct Angio Chest Pe W And/or Wo Contrast  Result Date: 09/01/2018 CLINICAL DATA:  Patient lost consciousness and fell. EXAM: CT ANGIOGRAPHY CHEST WITH CONTRAST TECHNIQUE:  Multidetector CT imaging of the chest was performed using the standard protocol during bolus administration of intravenous contrast. Multiplanar CT image reconstructions and MIPs were obtained to evaluate the vascular anatomy. CONTRAST:  17mL OMNIPAQUE IOHEXOL 350 MG/ML SOLN COMPARISON:  None. FINDINGS: Cardiovascular: Motion  artifact limits examination. There is good opacification of the central and segmental pulmonary arteries. No focal filling defects. No evidence of significant pulmonary embolus. Normal heart size. No pericardial effusions. Normal caliber thoracic aorta with scattered calcifications. Mediastinum/Nodes: Esophagus is decompressed with an enteric tube in place. Endotracheal tube in place with tip above the carina. No significant lymphadenopathy. Lungs/Pleura: Consolidation and volume loss in the right upper lung with patchy airspace infiltrates elsewhere in the right lung. Appearance likely represents pneumonia. Airways are patent. No pleural effusions. No pneumothorax. Upper Abdomen: Enteric tube tip is in the body of the stomach along the greater curvature. Musculoskeletal: Degenerative changes in the spine. No destructive bone lesions. Review of the MIP images confirms the above findings. IMPRESSION: No evidence of significant pulmonary embolus. Consolidation and volume loss in the right upper lung with patchy airspace infiltrates throughout the right lung likely representing pneumonia. Electronically Signed   By: Burman Nieves M.D.   On: 09/01/2018 01:31   Dg Chest Port 1 View  Result Date: 09/01/2018 CLINICAL DATA:  Encounter for central line placement EXAM: PORTABLE CHEST 1 VIEW COMPARISON:  Earlier today FINDINGS: Endotracheal tube tip between the clavicular heads and carina. Right IJ line with tip at the SVC. No pneumothorax or new mediastinal widening. The orogastric tube at least reaches the stomach. Hazy opacity at the right upper lobe which is likely improved since earlier. IMPRESSION: 1. New central line without complicating feature. 2. Improving right upper lobe aeration. Electronically Signed   By: Marnee Spring M.D.   On: 09/01/2018 05:56   Dg Chest Portable 1 View  Result Date: 09/01/2018 CLINICAL DATA:  Respiratory distress. Post intubation, line placement, OG tube placement EXAM: PORTABLE  CHEST 1 VIEW COMPARISON:  None. FINDINGS: The endotracheal tube is low, measuring only about 8 mm above the carina. Enteric tube tip is shown in the left upper quadrant consistent with location in the upper stomach. No radiopaque central venous catheters are identified. Heart size and pulmonary vascularity are normal. Lungs are clear and expanded. No blunting of costophrenic angles. No pneumothorax. Mediastinal contours appear intact. IMPRESSION: 1. Endotracheal tube is low, measuring about 8 mm above the carina. Enteric tube tip in the left upper quadrant consistent with location in the upper stomach. Lungs are clear. 2. No pneumothorax seen. Electronically Signed   By: Burman Nieves M.D.   On: 09/01/2018 00:49   ASSESSMENT AND PLAN:     1. Cardiac arrest [facility]) -unclear etiology upfront.  Patient had out of hospital cardiopulmonary arrest.  Exact etiology not clear.  Given sudden onset arrhythmia cannot be excluded. Seen by cardiologist.  2D echocardiogram done revealed ejection fraction of 50 to 55%.  Cavity size was normal.   Minimally elevated troponin likely related to prolonged CPR.  No evidence of acute coronary syndrome.  Heparin drip was discontinued. Being managed by critical care team and cardiology service.  2.  Myoclonus following cardiac arrest. Seen by neurologist.  Full neuro exam cannot be done due to patient being sedated on the vent at this time.  Recommendation is to repeat CT head in a.m. and get EEG. Further recommendations per neurology.  3.  Acute respiratory failure with hypoxia and hypercapnia Being managed by critical  care team.   4.  Septic shock secondary to right upper lobe pneumonia Patient sedated on the vent.  Currently requiring Levophed drip Continue broad-spectrum IV antibiotics with IV vancomycin and cefepime.  Patient noted to be on Flagyl as well.  5.  COPD exacerbation.  On IV Solu-Medrol.  Nebulizer treatments.  Empiric antibiotics.  Vent management  by pulmonary and critical care physician  6.  Diabetes mellitus type 2 Monitor blood sugars and adjust regimen as needed.  DVT prophylaxis; heparin  All the records are reviewed and case discussed with Care Management/Social Worker. Management plans discussed with the patient, family and they are in agreement.  CODE STATUS: Full Code  TOTAL TIME TAKING CARE OF THIS PATIENT: 40 minutes.   More than 50% of the time was spent in counseling/coordination of care: YES  POSSIBLE D/C IN 3 DAYS, DEPENDING ON CLINICAL CONDITION.   Jase Reep M.D on 09/01/2018 at 3:25 PM  Between 7am to 6pm - Pager - [phone]  After 6pm go to [email or web address] - Social research officer, government  [facility] [place] [facility]  Office  [phone]  CC: Primary care physician; System, Pcp Not In  Note: This dictation was prepared with Dragon dictation along with smaller phrase technology. Any transcriptional errors that result from this process are unintentional.

## 2018-09-01 NOTE — ED Provider Notes (Signed)
[facility] Emergency Department Provider Note  ____________________________________________  Time seen: Approximately 1:00 AM  I have reviewed the triage vital signs and the nursing notes.   HISTORY  Chief Complaint Respiratory Arrest  Level 5 caveat:  Portions of the history and physical were unable to be obtained due to cardiac arrest   HPI Suzanne Rocha is a 63 y.o. female the history of COPD, CHF, diabetes and hypertension who presents post respiratory and cardiac arrest.  According to husband and daughter patient started to complain of shortness of breath this evening.  This morning patient was in her usual state of health.  The shortness of breath was sudden in onset and became severe pretty quickly.  Husband reports that patient collapsed at home.  Did not sustain any trauma and was assisted to the ground by him.  When EMS arrived patient was in asystole.  After 2 rounds of epinephrine and CPR patient regained pulses.  Remained with no neurological exam.  According to the husband patient has had no fever or chills, no cough or congestion, no vomiting or diarrhea, no chest pain or abdominal pain prior to her arrest.  Past Medical History:  Diagnosis Date  . CHF (congestive heart failure) (HCC)   . COPD (chronic obstructive pulmonary disease) (HCC)   . Diabetes mellitus without complication (HCC)   . Hypertension     Patient Active Problem List   Diagnosis Date Noted  . Cardiac arrest (HCC) 09/01/2018  . Acute respiratory failure with hypoxia and hypercapnia (HCC) 09/01/2018  . Severe sepsis (HCC) 09/01/2018  . Aspiration pneumonia (HCC) 09/01/2018  . Diabetes (HCC) 09/01/2018  . HTN (hypertension) 09/01/2018  . COPD with acute exacerbation (HCC) 09/01/2018    Past Surgical History:  Procedure Laterality Date  . ABDOMINAL HYSTERECTOMY    . THROAT SURGERY      Prior to Admission medications   Not on File    Allergies Erythromycin and  Penicillins  No family history on file.  Social History Social History   Tobacco Use  . Smoking status: Current Every Day Smoker  . Smokeless tobacco: Never Used  Substance Use Topics  . Alcohol use: Yes  . Drug use: Not on file    Review of Systems  Constitutional: Negative for fever. Respiratory: + shortness of breath.  ____________________________________________   PHYSICAL EXAM:  VITAL SIGNS: ED Triage Vitals  Enc Vitals Group     BP 08/26/2018 2356 (!) 132/102     Pulse Rate 09/22/2018 2356 90     Resp 09/02/2018 2356 11     Temp 09/01/18 0044 (!) 94.8 F (34.9 C)     Temp Source 09/01/18 0044 Rectal     SpO2 09/10/2018 2356 97 %     Weight 09/01/18 0008 220 lb (99.8 kg)     Height --      Head Circumference --      Peak Flow --      Pain Score --      Pain Loc --      Pain Edu? --      Excl. in GC? --     Constitutional: GCS 3 with King airway in place HEENT:      Head: Normocephalic and atraumatic.         Eyes: pupils fixed       Mouth/Throat: dry vomit      Neck: Supple with no signs of meningismus. Cardiovascular: Tachycardic with regular rhythm. No murmurs, gallops, or rubs.  2+ symmetrical distal pulses are present in all extremities. No JVD. Respiratory: Mechanical bilateral breath sounds with severely diminished air movement.  Gastrointestinal: Soft, non distended with positive bowel sounds.  Musculoskeletal: No edema, cyanosis, or erythema of extremities. Neurologic: GCS3, fixed dilated pupils. Skin: Skin is warm, dry and intact. No rash noted.  ____________________________________________   LABS (all labs ordered are listed, but only abnormal results are displayed)  Labs Reviewed  BLOOD GAS, VENOUS - Abnormal; Notable for the following components:      Result Value   pH, Ven <6.900 (*)    pCO2, Ven 119 (*)    pO2, Ven 130.0 (*)    All other components within normal limits  CBC WITH DIFFERENTIAL/PLATELET - Abnormal; Notable for the following  components:   WBC 17.5 (*)    HCT 46.9 (*)    MCV 101.5 (*)    All other components within normal limits  TROPONIN I - Abnormal; Notable for the following components:   Troponin I 0.09 (*)    All other components within normal limits  COMPREHENSIVE METABOLIC PANEL - Abnormal; Notable for the following components:   Potassium 5.7 (*)    CO2 18 (*)    Glucose, Bld 307 (*)    Creatinine, Ser 1.11 (*)    Calcium 8.7 (*)    Total Protein 6.1 (*)    AST 135 (*)    ALT 84 (*)    GFR calc non Af Amer 54 (*)    All other components within normal limits  LACTIC ACID, PLASMA - Abnormal; Notable for the following components:   Lactic Acid, Venous >11.0 (*)    All other components within normal limits  CULTURE, BLOOD (ROUTINE X 2)  CULTURE, BLOOD (ROUTINE X 2)  LACTIC ACID, PLASMA  URINALYSIS, COMPLETE (UACMP) WITH MICROSCOPIC  URINE DRUG SCREEN, QUALITATIVE ([facility] ONLY)  INFLUENZA PANEL BY PCR (TYPE A & B)  URINALYSIS, ROUTINE W REFLEX MICROSCOPIC   ____________________________________________  EKG  ED ECG REPORT I, Nita Sickle, the attending physician, personally viewed and interpreted this ECG.  Normal sinus rhythm, right bundle branch block, inferior Q waves, T wave inversions in aVL and V2 with no ST elevation.  No prior for comparison. ____________________________________________  RADIOLOGY  I have personally reviewed the images performed during this visit and I agree with the Radiologist's read.   Interpretation by Radiologist:  Dg Abdomen 1 View  Result Date: 09/01/2018 CLINICAL DATA:  OG tube placement. EXAM: ABDOMEN - 1 VIEW COMPARISON:  None. FINDINGS: Enteric tube tip is in the left upper quadrant consistent with location in the upper stomach. Gas-filled nondistended transverse colon. IMPRESSION: Enteric tube tip is in the left upper quadrant consistent with location in the upper stomach. Electronically Signed   By: Burman Nieves M.D.   On: 09/01/2018 00:50    Ct Head Wo Contrast  Result Date: 09/01/2018 CLINICAL DATA:  Patient loss consciousness and fell to the floor. Unresponsive upon EMS arrival. EXAM: CT HEAD WITHOUT CONTRAST TECHNIQUE: Contiguous axial images were obtained from the base of the skull through the vertex without intravenous contrast. COMPARISON:  None. FINDINGS: Brain: No evidence of acute infarction, hemorrhage, hydrocephalus, extra-axial collection or mass lesion/mass effect. Vascular: No hyperdense vessel or unexpected calcification. Skull: Calvarium appears intact. Sinuses/Orbits: Mild mucosal thickening in the paranasal sinuses. No acute air-fluid levels. Mastoid air cells are clear. Other: None. IMPRESSION: No acute intracranial abnormalities. Electronically Signed   By: Burman Nieves M.D.   On: 09/01/2018 01:28  Ct Angio Chest Pe W And/or Wo Contrast  Result Date: 09/01/2018 CLINICAL DATA:  Patient lost consciousness and fell. EXAM: CT ANGIOGRAPHY CHEST WITH CONTRAST TECHNIQUE: Multidetector CT imaging of the chest was performed using the standard protocol during bolus administration of intravenous contrast. Multiplanar CT image reconstructions and MIPs were obtained to evaluate the vascular anatomy. CONTRAST:  36mL OMNIPAQUE IOHEXOL 350 MG/ML SOLN COMPARISON:  None. FINDINGS: Cardiovascular: Motion artifact limits examination. There is good opacification of the central and segmental pulmonary arteries. No focal filling defects. No evidence of significant pulmonary embolus. Normal heart size. No pericardial effusions. Normal caliber thoracic aorta with scattered calcifications. Mediastinum/Nodes: Esophagus is decompressed with an enteric tube in place. Endotracheal tube in place with tip above the carina. No significant lymphadenopathy. Lungs/Pleura: Consolidation and volume loss in the right upper lung with patchy airspace infiltrates elsewhere in the right lung. Appearance likely represents pneumonia. Airways are patent. No pleural  effusions. No pneumothorax. Upper Abdomen: Enteric tube tip is in the body of the stomach along the greater curvature. Musculoskeletal: Degenerative changes in the spine. No destructive bone lesions. Review of the MIP images confirms the above findings. IMPRESSION: No evidence of significant pulmonary embolus. Consolidation and volume loss in the right upper lung with patchy airspace infiltrates throughout the right lung likely representing pneumonia. Electronically Signed   By: Burman Nieves M.D.   On: 09/01/2018 01:31   Dg Chest Portable 1 View  Result Date: 09/01/2018 CLINICAL DATA:  Respiratory distress. Post intubation, line placement, OG tube placement EXAM: PORTABLE CHEST 1 VIEW COMPARISON:  None. FINDINGS: The endotracheal tube is low, measuring only about 8 mm above the carina. Enteric tube tip is shown in the left upper quadrant consistent with location in the upper stomach. No radiopaque central venous catheters are identified. Heart size and pulmonary vascularity are normal. Lungs are clear and expanded. No blunting of costophrenic angles. No pneumothorax. Mediastinal contours appear intact. IMPRESSION: 1. Endotracheal tube is low, measuring about 8 mm above the carina. Enteric tube tip in the left upper quadrant consistent with location in the upper stomach. Lungs are clear. 2. No pneumothorax seen. Electronically Signed   By: Burman Nieves M.D.   On: 09/01/2018 00:49    ____________________________________________   PROCEDURES  Procedure(s) performed:yes Procedure Name: Intubation Date/Time: 09/01/2018 1:05 AM Performed by: Nita Sickle, MD Pre-anesthesia Checklist: Patient identified, Emergency Drugs available, Suction available and Patient being monitored Preoxygenation: Pre-oxygenation with 100% oxygen Induction Type: IV induction and Rapid sequence Laryngoscope Size: Glidescope Tube size: 7.5 mm Number of attempts: 1 Airway Equipment and Method:  Video-laryngoscopy Placement Confirmation: ETT inserted through vocal cords under direct vision,  CO2 detector,  Breath sounds checked- equal and bilateral and Positive ETCO2 Secured at: 23 cm Tube secured with: ETT holder Dental Injury: Teeth and Oropharynx as per pre-operative assessment       Critical Care performed: yes  CRITICAL CARE Performed by: Nita Sickle  ?  Total critical care time: 40 min  Critical care time was exclusive of separately billable procedures and treating other patients.  Critical care was necessary to treat or prevent imminent or life-threatening deterioration.  Critical care was time spent personally by me on the following activities: development of treatment plan with patient and/or surrogate as well as nursing, discussions with consultants, evaluation of patient's response to treatment, examination of patient, obtaining history from patient or surrogate, ordering and performing treatments and interventions, ordering and review of laboratory studies, ordering and review of radiographic studies, pulse  oximetry and re-evaluation of patient's condition.  ____________________________________________   INITIAL IMPRESSION / ASSESSMENT AND PLAN / ED COURSE  63 y.o. female the history of COPD, CHF, diabetes and hypertension who presents post respiratory and cardiac arrest.  Patient with sudden onset of progressively worsening shortness of breath leading to respiratory and cardiac arrest.  Achieved ROSC in the field after 2 rounds of CPR and epinephrine.  Arrives a GCS of 3, pupils are fixed and dilated.  Patient had a King airway.  Initial EKG showing no acute ischemic changes.  Slightly prolonged intervals for which calcium was given for concerns of possible hyperkalemia.  King airway was exchanged for an ET tube with etomidate and rocuronium.  Patient had been on dopamine which had been started by EMS.  Pressors were discontinued in the emergency room.   Patient on Precedex for sedation.  Initial ABG showing a pH of less than 6.8 with a PCO2 of 119 and a PO2 of 130.  Patient was given 2 boluses of bicarb.  Started on 3 duo nebs and Solu-Medrol.  Chest x-ray with no infiltrate.  Lactate greater than 11 most likely due to cardiac arrest however sepsis also possible and patient was given cefepime and Vanco and IV fluids.  With no neurological exam cooling protocol was initiated in the emergency room.  Labs also showing hyperkalemia with K of 5.7 for which patient was given D50, insulin, bicarb, IV fluids, albuterol, and Lasix.  CT of the head requested by intensivist showed no acute findings.  Due to sudden onset of shortness of breath leading to cardiac arrest a CT angio was done to rule out a large PE and it showed R sided PNA and no PE. Clindamycin was added for possible aspiration PNA. Patient's husband and daughter were at the bedside and updated on patient's status.    _________________________ 1:44 AM on 09/01/2018 ----------------------------------------- Blood pressure is trending down.  Patient has received 2.5 L of fluid.  Will initiate norepinephrine for concerns of septic shock   As part of my medical decision making, I reviewed the following data within the electronic MEDICAL RECORD NUMBER History obtained from family, Nursing notes reviewed and incorporated, Labs reviewed , EKG interpreted , Radiograph reviewed , Discussed with admitting physician , Notes from prior ED visits and [place] Controlled Substance Database    Pertinent labs & imaging results that were available during my care of the patient were reviewed by me and considered in my medical decision making (see chart for details).    ____________________________________________   FINAL CLINICAL IMPRESSION(S) / ED DIAGNOSES  Final diagnoses:  Respiratory arrest (HCC)  Cardiac arrest (HCC)  Community acquired pneumonia, unspecified laterality  Sepsis with acute hypercapnic  respiratory failure and septic shock, due to unspecified organism [facility])      NEW MEDICATIONS STARTED DURING THIS VISIT:  ED Discharge Orders    None       Note:  This document was prepared using Dragon voice recognition software and may include unintentional dictation errors.    Don Perking, [place], MD 09/01/18 0430

## 2018-09-01 NOTE — Progress Notes (Signed)
eLink Physician-Brief Progress Note Patient Name: Shimere Levere DOB: 1955/09/13 MRN: [id number]   Date of Service  09/01/2018  HPI/Events of Note  63 y.o. female who presented to the ED post cardiac arrest.  Family reports that she had been well today, her usual self, until this evening when she experienced acute onset shortness of breath.  She called in respiratory distress for help.  EMS was called and when they arrived patient became unresponsive and had asystole arrest.  She had 2 rounds of CPR with 2 doses of epi given in route to the ED and achieved ROSC.  On arrival here she had her [facility] airway exchanged for ET tube.  ED physician reports an episode of vomiting and concerns for possible aspiration.  Her blood pressure in the ED has been low requiring vasopressors. Now intubated and ventilated. PCCM asked to assume care in the ICU. VSS.   eICU Interventions  No new orders.      Intervention Category Evaluation Type: New Patient Evaluation  Lenell Antu 09/01/2018, 2:58 AM

## 2018-09-01 NOTE — Progress Notes (Signed)
Pt has remained stable this shift, levo titrated down and currently on hold to see if BP holds without. Pt currently still being cooled to 36C. During last turn and oral care, pt appeared to have more response to stimulation, with possible eye opening during oral care. Family has been in and out of room throughout shift and updated.

## 2018-09-01 NOTE — Consult Note (Signed)
PULMONARY / CRITICAL CARE MEDICINE  Name: Suzanne Rocha MRN: [id number] DOB: [birth year]    LOS: 0  Referring Provider: Dr. Anne Hahn Reason for Referral: Cardiac arrest Brief patient description: 63 year old female presenting with an out of hospital cardiac arrest.  Remains unresponsive post resuscitation.  Now being admitted to the ICU for targeted temperature management  HPI: This is a 63 year old female with a medical history as indicated below who presented to the hospital via EMS after an out of hospital cardiac arrest.  History is obtained from patient's spouse as patient is currently intubated.  Per patient's spouse, at about 10 PM, patient started complaining of acute dyspnea.  Symptoms quickly got worse and patient requested to be taken to the emergency room hence he called 911.  While they were waiting for the ambulance outside, patient became unresponsive and collapsed.  CPR was initiated by patient's granddaughter and the spouse and continued for approximately 20 minutes prior to EMS arrival.  When EMS arrived, CPR was performed for about 30 minutes with return of spontaneous circulation.  Initial rhythm documented was asystole.  Patient was transferred to the emergency room with a King airway  in place.  Upon arrival in the emergency room, she was emergently intubated.  Per ED documentation, patient had one episode of emesis.  CT head and initial chest x-ray were unremarkable.  CT angiogram PE study was negative for any acute PE but showed right upper lobe infiltrate suggestive of pneumonia.  Labs indicated a VBG with a pH less than 6.9, PCO2 of 119 and a PO2 of 130.  Her blood glucose was 307 mg/dL, potassium was 5.7 and WBC of 17.5..  Initial lactic acid was greater than 11 and her troponin was 0.09.  Her LFTs were also elevated and her initial EKG did not show any acute ST changes.  Toxicology was negative.  She has been started on pressors, broad-spectrum antibiotics and IV steroids.  She is  being admitted to the ICU for further  Management. Upon arrival in the ICU,  patient had a super large loose foul-smelling stool  Past Medical History:  Diagnosis Date  . CHF (congestive heart failure) (HCC)   . COPD (chronic obstructive pulmonary disease) (HCC)   . Diabetes mellitus without complication (HCC)   . Hypertension    Past Surgical History:  Procedure Laterality Date  . ABDOMINAL HYSTERECTOMY    . THROAT SURGERY     Prior to Admission medications   Medication Sig Start Date End Date Taking? Authorizing Provider  amLODipine (NORVASC) 5 MG tablet Take 5 mg by mouth daily.   Yes [provider]  clopidogrel (PLAVIX) 75 MG tablet Take 75 mg by mouth daily.   Yes [provider]  donepezil (ARICEPT) 5 MG tablet Take 1 tablet (5 mg total) by mouth at bedtime. 02/17/18 03/29/18 Yes Sowles, Danna Hefty, MD  empagliflozin (JARDIANCE) 25 MG TABS tablet Take 25 mg by mouth daily.   Yes [provider]  glycopyrrolate (ROBINUL) 1 MG tablet Take 1 mg by mouth 2 (two) times daily.   Yes [provider]  insulin aspart (NOVOLOG FLEXPEN) 100 UNIT/ML FlexPen Inject 12 Units into the skin 2 (two) times daily.   Yes [provider]  insulin aspart (NOVOLOG) 100 UNIT/ML FlexPen Inject 18 Units into the skin daily. At 1700   Yes [provider]  Insulin Degludec-Liraglutide (XULTOPHY) 100-3.6 UNIT-MG/ML SOPN Inject 50 Units into the skin daily.   Yes [provider]  levETIRAcetam (KEPPRA) 500 MG  tablet Take 500 mg by mouth 2 (two) times daily.   Yes [provider]  lipase/protease/amylase (CREON) 12000 units CPEP capsule Take 6,000 Units by mouth 3 (three) times daily before meals.   Yes [provider]  lipase/protease/amylase (CREON) 12000 units CPEP capsule Take 3,000 Units by mouth at bedtime. With snack   Yes [provider]  lisinopril (PRINIVIL,ZESTRIL) 5 MG tablet Take 5 mg by mouth daily.   Yes [provider]  metoprolol succinate (TOPROL-XL) 25 MG 24 hr tablet Take 1 tablet (25 mg total) by mouth daily. 02/17/18  Yes Sowles, Danna Hefty, MD  rosuvastatin (CRESTOR) 40 MG tablet Take 1 tablet (40 mg total) by mouth daily. 02/17/18 03/29/18 Yes Alba Cory, MD  aspirin EC 81 MG tablet Take 81 mg by mouth daily.    [provider]  famotidine (PEPCID) 20 MG tablet Take 1 tablet (20 mg total) by mouth 2 (two) times daily. 02/17/18 03/19/18  Alba Cory, MD  gabapentin (NEURONTIN) 300 MG capsule Take 1 capsule (300 mg total) by mouth 2 (two) times daily. 02/17/18 03/19/18  Alba Cory, MD  insulin glargine (LANTUS) 100 UNIT/ML injection Inject 0.1 mLs (10 Units total) into the skin daily. 02/17/18 03/19/18  Alba Cory, MD  lacosamide 100 MG TABS Take 1 tablet (100 mg total) by mouth 2 (two) times daily. Patient not taking: Reported on 03/29/2018 07/05/17   Enedina Finner, MD  promethazine (PHENERGAN) 12.5 MG tablet Take 1 tablet (12.5 mg total) by mouth every 6 (six) hours as needed for nausea or vomiting. Patient not taking: Reported on 03/29/2018 04/23/17   Almond Lint, MD  sertraline (ZOLOFT) 25 MG tablet Take 1 tablet (25 mg total) by mouth daily. Patient not taking: Reported on 03/29/2018 02/17/18   Alba Cory, MD   Allergies Allergies  Allergen Reactions  . Erythromycin   . Penicillins    Family History No family history on file. Social History  reports that she has been smoking. She has never used smokeless tobacco. She reports current alcohol use. No history on file for drug.  Review Of Systems: Unable to obtain as patient is currently intubated  VITAL SIGNS: BP (!) 88/73   Pulse 90   Temp (!) 94.8 F (34.9 C) (Rectal)   Resp 16   Wt 99.8 kg   SpO2 100%   HEMODYNAMICS:    VENTILATOR SETTINGS: Vent Mode: AC FiO2 (%):  [50 %-60 %] 50 % Set Rate:  [10 bmp-16 bmp] 16 bmp Vt Set:  [550 mL] 550 mL PEEP:  [5 cmH20] 5 cmH20  INTAKE / OUTPUT: No  intake/output data recorded.  PHYSICAL EXAMINATION: General: Well-nourished, well-developed, in no acute distress HEENT: Normocephalic and atraumatic, trachea midline, no JVD Neuro: Unresponsive to voice touch and noxious stimulus, corneal reflexes absent, pupils dilated and fixed Cardiovascular: Apical pulse regular, S1-S2, no murmur regurg or gallop, +2 pulses bilaterally, no edema Lungs: Bilateral breath sounds with diffuse wheezing right lung fields greater than left, diffuse rhonchi and no crackles Abdomen: Obese, hypoactive bowel sounds in all 4 quadrants, palpation reveals no organomegaly Musculoskeletal: Positive range of motion, no joint deformities Skin: Pale and cyanotic  LABS:  BMET Recent Labs  Lab 09/29/18 2357  NA 141  K 5.7*  CL 108  CO2 18*  BUN 11  CREATININE 1.11*  GLUCOSE 307*    Electrolytes Recent Labs  Lab 09-29-2018 2357  CALCIUM 8.7*    CBC Recent Labs  Lab 2018/09/29 2357  WBC 17.5*  HGB  14.8  HCT 46.9*  PLT 234    Coag's No results for input(s): APTT, INR in the last 168 hours.  Sepsis Markers Recent Labs  Lab 2018/09/25 2357 09/01/18 0203  LATICACIDVEN >11.0* 6.6*    ABG Recent Labs  Lab 09/01/18 0152  PHART 7.21*  PCO2ART 60*  PO2ART 179*    Liver Enzymes Recent Labs  Lab 09/25/18 2357  AST 135*  ALT 84*  ALKPHOS 79  BILITOT 0.6  ALBUMIN 3.7    Cardiac Enzymes Recent Labs  Lab 2018-09-25 2357  TROPONINI 0.09*    Glucose Recent Labs  Lab 09/01/18 0300  GLUCAP 284*    Imaging Dg Abdomen 1 View  Result Date: 09/01/2018 CLINICAL DATA:  OG tube placement. EXAM: ABDOMEN - 1 VIEW COMPARISON:  None. FINDINGS: Enteric tube tip is in the left upper quadrant consistent with location in the upper stomach. Gas-filled nondistended transverse colon. IMPRESSION: Enteric tube tip is in the left upper quadrant consistent with location in the upper stomach. Electronically Signed   By: Burman Nieves M.D.   On: 09/01/2018  00:50   Ct Head Wo Contrast  Result Date: 09/01/2018 CLINICAL DATA:  Patient loss consciousness and fell to the floor. Unresponsive upon EMS arrival. EXAM: CT HEAD WITHOUT CONTRAST TECHNIQUE: Contiguous axial images were obtained from the base of the skull through the vertex without intravenous contrast. COMPARISON:  None. FINDINGS: Brain: No evidence of acute infarction, hemorrhage, hydrocephalus, extra-axial collection or mass lesion/mass effect. Vascular: No hyperdense vessel or unexpected calcification. Skull: Calvarium appears intact. Sinuses/Orbits: Mild mucosal thickening in the paranasal sinuses. No acute air-fluid levels. Mastoid air cells are clear. Other: None. IMPRESSION: No acute intracranial abnormalities. Electronically Signed   By: Burman Nieves M.D.   On: 09/01/2018 01:28   Ct Angio Chest Pe W And/or Wo Contrast  Result Date: 09/01/2018 CLINICAL DATA:  Patient lost consciousness and fell. EXAM: CT ANGIOGRAPHY CHEST WITH CONTRAST TECHNIQUE: Multidetector CT imaging of the chest was performed using the standard protocol during bolus administration of intravenous contrast. Multiplanar CT image reconstructions and MIPs were obtained to evaluate the vascular anatomy. CONTRAST:  33mL OMNIPAQUE IOHEXOL 350 MG/ML SOLN COMPARISON:  None. FINDINGS: Cardiovascular: Motion artifact limits examination. There is good opacification of the central and segmental pulmonary arteries. No focal filling defects. No evidence of significant pulmonary embolus. Normal heart size. No pericardial effusions. Normal caliber thoracic aorta with scattered calcifications. Mediastinum/Nodes: Esophagus is decompressed with an enteric tube in place. Endotracheal tube in place with tip above the carina. No significant lymphadenopathy. Lungs/Pleura: Consolidation and volume loss in the right upper lung with patchy airspace infiltrates elsewhere in the right lung. Appearance likely represents pneumonia. Airways are patent. No  pleural effusions. No pneumothorax. Upper Abdomen: Enteric tube tip is in the body of the stomach along the greater curvature. Musculoskeletal: Degenerative changes in the spine. No destructive bone lesions. Review of the MIP images confirms the above findings. IMPRESSION: No evidence of significant pulmonary embolus. Consolidation and volume loss in the right upper lung with patchy airspace infiltrates throughout the right lung likely representing pneumonia. Electronically Signed   By: Burman Nieves M.D.   On: 09/01/2018 01:31   Dg Chest Portable 1 View  Result Date: 09/01/2018 CLINICAL DATA:  Respiratory distress. Post intubation, line placement, OG tube placement EXAM: PORTABLE CHEST 1 VIEW COMPARISON:  None. FINDINGS: The endotracheal tube is low, measuring only about 8 mm above the carina. Enteric tube tip is shown in the left upper quadrant consistent  with location in the upper stomach. No radiopaque central venous catheters are identified. Heart size and pulmonary vascularity are normal. Lungs are clear and expanded. No blunting of costophrenic angles. No pneumothorax. Mediastinal contours appear intact. IMPRESSION: 1. Endotracheal tube is low, measuring about 8 mm above the carina. Enteric tube tip in the left upper quadrant consistent with location in the upper stomach. Lungs are clear. 2. No pneumothorax seen. Electronically Signed   By: Burman Nieves M.D.   On: 09/01/2018 00:49    STUDIES:  EEG 2D echo  CULTURES: Blood cultures x2 Urine culture  ANTIBIOTICS: Vancomycin Cefepime Flagyl  SIGNIFICANT EVENTS: 09/01/2018: Admitted  LINES/TUBES: Right IJ triple-lumen catheter Right femoral A-line IOs Bilateral shin  DISCUSSION: 63 year old female presenting with cardiac arrest, cardiogenic shock, right upper lobe pneumonia likely aspiration acute hypoxic, severe hyperglycemia and hypercarbic respiratory failure and severe diarrhea  ASSESSMENT / PLAN:  PULMONARY A: Acute  hypoxic and hypercarbic respiratory failure Severe lactic acidosis Right upper lobe pneumonia likely aspiration Acute COPD exacerbation Tobacco use disorder P:   Full vent support with current settings ABG post intubation and repeat ABG reviewed Chest x-ray and ABG as needed Nebulized bronchodilators Inhaled steroids and IV steroids VAP protocol No weaning trials until targeted temperature management is completed  CARDIOVASCULAR A:  Cardiac arrest-initial rhythm asystole, most likely respiratory etiology; PE ruled out Cardiogenic shock Elevated troponin without acute ST changes on EKG P:  Hemodynamic monitoring per ICU protocol CVP every 4 hours IV fluids and pressors to maintain mean arterial blood pressure greater than 65 2D echo Cardiology consult Cycle cardiac enzymes Serial EKGs Heparin infusion  RENAL A:   Acute renal insufficiency Hyperkalemia P:   Trend creatinine IV fluids Monitor and correct electrolyte imbalances  GASTROINTESTINAL A:   Severe diarrhea P:   Stool culture for C. difficile and GI panel Flagyl 500 mg IV every 8 hours Contact precautions  INFECTIOUS A:   Aspiration pneumonia Leukocytosis P:   Follow-up cultures Antibiotics as above Trend procalcitonin and adjust antibiotics  ENDOCRINE A:   Hyperglycemia Type 2 diabetes    P:  ICU IV insulin protocol and transition to subcu insulin as needed Hemoglobin A1c with a.m. labs  NEUROLOGIC A:   Acute encephalopathy secondary to cardiac arrest and respiratory failure P:   RASS goal: 0 Neurology consult CT head negative EEG Neurochecks per protocol  Best Practice: Code Status: Full code Diet: N.p.o. GI prophylaxis: Famotidine VTE prophylaxis: SCDs/heparin  FAMILY  - Updates: Patient's daughter and husband updated at bedside.  All questions answered.  Josefina Rynders S. Tukov-Yual ANP-BC Pulmonary and Critical Care Medicine [facility] Pager [phone] or  [phone]  NB: This document was prepared using Dragon voice recognition software and may include unintentional dictation errors.    09/01/2018, 3:23 AM

## 2018-09-01 NOTE — ED Notes (Signed)
Patient transported to CT 

## 2018-09-01 NOTE — Progress Notes (Signed)
Post abg fio2 to 45% tolerating well. 

## 2018-09-01 NOTE — H&P (Signed)
[facility] [place] [facility]   PATIENT NAME: Suzanne Rocha    MR#:  [id number]  DATE OF BIRTH:  10-26-1955  DATE OF ADMISSION:  08/26/2018  PRIMARY CARE PHYSICIAN: No primary care provider on file.   REQUESTING/REFERRING PHYSICIAN: Don Perking, MD  CHIEF COMPLAINT:   Chief Complaint  Patient presents with  . Respiratory Arrest    HISTORY OF PRESENT ILLNESS:  Suzanne Rocha  is a 63 y.o. female who presents with chief complaint as above.  Presented to the ED post cardiac arrest.  Family reports that she had been well today, her usual self, until this evening when she experienced acute onset shortness of breath.  She called in respiratory distress for help.  EMS was called and when they arrived patient became unresponsive and had asystole arrest.  She had 2 rounds of CPR with 2 doses of epi given in route to the ED and achieved ROSC.  On arrival here she had her [facility] airway exchanged for ET tube.  ED physician reports an episode of vomiting and concerns for possible aspiration.  Her blood pressure in the ED has been low requiring vasopressors.  She was given IV antibiotics, and IV Solu-Medrol.  Patient does have a history of COPD, with significant persistent wheezing.  Her blood gas was significantly hypercarbic.  Hospitalist were called for admission  PAST MEDICAL HISTORY:   Past Medical History:  Diagnosis Date  . CHF (congestive heart failure) (HCC)   . COPD (chronic obstructive pulmonary disease) (HCC)   . Diabetes mellitus without complication (HCC)   . Hypertension      PAST SURGICAL HISTORY:   Past Surgical History:  Procedure Laterality Date  . ABDOMINAL HYSTERECTOMY    . THROAT SURGERY       SOCIAL HISTORY:   Social History   Tobacco Use  . Smoking status: Current Every Day Smoker  . Smokeless tobacco: Never Used  Substance Use Topics  . Alcohol use: Yes     FAMILY HISTORY:    Family history reviewed and is  non-contributory DRUG ALLERGIES:   Allergies  Allergen Reactions  . Erythromycin   . Penicillins     MEDICATIONS AT HOME:   Prior to Admission medications   Not on File    REVIEW OF SYSTEMS:  Review of Systems  Unable to perform ROS: Critical illness     VITAL SIGNS:   Vitals:   09/01/18 0125 09/01/18 0130 09/01/18 0133 09/01/18 0136  BP: 91/78 93/78 (!) 85/70 (!) 88/73  Pulse: 88 85 87 90  Resp: (!) 45 (!) 21 16 16   Temp:      TempSrc:      SpO2: 100% 100% 100% 100%  Weight:       Wt Readings from Last 3 Encounters:  09/01/18 99.8 kg    PHYSICAL EXAMINATION:  Physical Exam  Vitals reviewed. Constitutional: She appears well-developed and well-nourished. No distress.  HENT:  Head: Normocephalic and atraumatic.  Mouth/Throat: Oropharynx is clear and moist.  Eyes: Pupils are equal, round, and reactive to light. Conjunctivae and EOM are normal. No scleral icterus.  Neck: Normal range of motion. Neck supple. No JVD present. No thyromegaly present.  Cardiovascular: Normal rate, regular rhythm and intact distal pulses. Exam reveals no gallop and no friction rub.  No murmur heard. Respiratory: She is in respiratory distress (Intubated and mechanically ventilated). She has wheezes. She has no rales.  GI: Soft. Bowel sounds are normal. She exhibits no distension. There is  no abdominal tenderness.  Musculoskeletal: Normal range of motion.        General: No edema.     Comments: Unable to assess due to critical condition  Lymphadenopathy:    She has no cervical adenopathy.  Neurological:  No dysarthria, no aphasia  Skin: Skin is warm and dry. No rash noted. No erythema.  Psychiatric:  Unable to assess due to critical condition    LABORATORY PANEL:   CBC Recent Labs  Lab Sep 01, 2018 2357  WBC 17.5*  HGB 14.8  HCT 46.9*  PLT 234   ------------------------------------------------------------------------------------------------------------------  Chemistries   Recent Labs  Lab Sep 01, 2018 2357  NA 141  K 5.7*  CL 108  CO2 18*  GLUCOSE 307*  BUN 11  CREATININE 1.11*  CALCIUM 8.7*  AST 135*  ALT 84*  ALKPHOS 79  BILITOT 0.6   ------------------------------------------------------------------------------------------------------------------  Cardiac Enzymes Recent Labs  Lab 09-01-18 2357  TROPONINI 0.09*   ------------------------------------------------------------------------------------------------------------------  RADIOLOGY:  Dg Abdomen 1 View  Result Date: 09/01/2018 CLINICAL DATA:  OG tube placement. EXAM: ABDOMEN - 1 VIEW COMPARISON:  None. FINDINGS: Enteric tube tip is in the left upper quadrant consistent with location in the upper stomach. Gas-filled nondistended transverse colon. IMPRESSION: Enteric tube tip is in the left upper quadrant consistent with location in the upper stomach. Electronically Signed   By: Burman Nieves M.D.   On: 09/01/2018 00:50   Ct Head Wo Contrast  Result Date: 09/01/2018 CLINICAL DATA:  Patient loss consciousness and fell to the floor. Unresponsive upon EMS arrival. EXAM: CT HEAD WITHOUT CONTRAST TECHNIQUE: Contiguous axial images were obtained from the base of the skull through the vertex without intravenous contrast. COMPARISON:  None. FINDINGS: Brain: No evidence of acute infarction, hemorrhage, hydrocephalus, extra-axial collection or mass lesion/mass effect. Vascular: No hyperdense vessel or unexpected calcification. Skull: Calvarium appears intact. Sinuses/Orbits: Mild mucosal thickening in the paranasal sinuses. No acute air-fluid levels. Mastoid air cells are clear. Other: None. IMPRESSION: No acute intracranial abnormalities. Electronically Signed   By: Burman Nieves M.D.   On: 09/01/2018 01:28   Ct Angio Chest Pe W And/or Wo Contrast  Result Date: 09/01/2018 CLINICAL DATA:  Patient lost consciousness and fell. EXAM: CT ANGIOGRAPHY CHEST WITH CONTRAST TECHNIQUE: Multidetector CT imaging  of the chest was performed using the standard protocol during bolus administration of intravenous contrast. Multiplanar CT image reconstructions and MIPs were obtained to evaluate the vascular anatomy. CONTRAST:  51mL OMNIPAQUE IOHEXOL 350 MG/ML SOLN COMPARISON:  None. FINDINGS: Cardiovascular: Motion artifact limits examination. There is good opacification of the central and segmental pulmonary arteries. No focal filling defects. No evidence of significant pulmonary embolus. Normal heart size. No pericardial effusions. Normal caliber thoracic aorta with scattered calcifications. Mediastinum/Nodes: Esophagus is decompressed with an enteric tube in place. Endotracheal tube in place with tip above the carina. No significant lymphadenopathy. Lungs/Pleura: Consolidation and volume loss in the right upper lung with patchy airspace infiltrates elsewhere in the right lung. Appearance likely represents pneumonia. Airways are patent. No pleural effusions. No pneumothorax. Upper Abdomen: Enteric tube tip is in the body of the stomach along the greater curvature. Musculoskeletal: Degenerative changes in the spine. No destructive bone lesions. Review of the MIP images confirms the above findings. IMPRESSION: No evidence of significant pulmonary embolus. Consolidation and volume loss in the right upper lung with patchy airspace infiltrates throughout the right lung likely representing pneumonia. Electronically Signed   By: Burman Nieves M.D.   On: 09/01/2018 01:31   Dg Chest  Portable 1 View  Result Date: 09/01/2018 CLINICAL DATA:  Respiratory distress. Post intubation, line placement, OG tube placement EXAM: PORTABLE CHEST 1 VIEW COMPARISON:  None. FINDINGS: The endotracheal tube is low, measuring only about 8 mm above the carina. Enteric tube tip is shown in the left upper quadrant consistent with location in the upper stomach. No radiopaque central venous catheters are identified. Heart size and pulmonary vascularity are  normal. Lungs are clear and expanded. No blunting of costophrenic angles. No pneumothorax. Mediastinal contours appear intact. IMPRESSION: 1. Endotracheal tube is low, measuring about 8 mm above the carina. Enteric tube tip in the left upper quadrant consistent with location in the upper stomach. Lungs are clear. 2. No pneumothorax seen. Electronically Signed   By: Burman Nieves M.D.   On: 09/01/2018 00:49    EKG:   Orders placed or performed during the hospital encounter of September 16, 2018  . ED EKG  . EKG 12-Lead  . EKG 12-Lead  . ED EKG    IMPRESSION AND PLAN:  Principal Problem:   Cardiac arrest (HCC) -unclear etiology upfront.  Strong suspicion for possible respiratory compromise, either due to COPD or potentially pneumonia, though it is unclear if her pneumonia was secondary to aspiration in the ED.  Subsequent cardiac arrest following respiratory compromise is strongly suspected.  However, patient does have a history of CHF listed on her chart, though chart review does not reveal any previous echocardiogram testing in our system.  Admit patient to ICU, get a cardiology consult, ICU physician recommends cooling the patient. Active Problems:   Acute respiratory failure with hypoxia and hypercapnia (HCC) -patient is currently intubated, continue mechanical ventilation for now   Severe sepsis (HCC) -IV antibiotics given, lactic acid is profoundly elevated though this is also likely largely in part due to her code event.  Cultures ordered.  Blood pressure is low requiring pressors upfront   Pneumonia (HCC) -IV antibiotics and other treatment as above   COPD with acute exacerbation (HCC) -IV Solu-Medrol and other treatment as above   Diabetes (HCC) -sliding scale insulin coverage   HTN (hypertension) -hold antihypertensives as patient is hypotensive at this time  Chart review performed and case discussed with ED provider. Labs, imaging and/or ECG reviewed by provider and discussed with  patient/family. Management plans discussed with the patient and/or family.  DVT PROPHYLAXIS: SubQ lovenox   GI PROPHYLAXIS:  H2 blocker  ADMISSION STATUS: Inpatient     CODE STATUS: Full  TOTAL CRITICAL CARE TIME TAKING CARE OF THIS PATIENT: 50 minutes.   Barney Drain 09/01/2018, 1:43 AM  [facility]  Office  [phone]  CC: Primary care physician; No primary care provider on file.  Note:  This document was prepared using Dragon voice recognition software and may include unintentional dictation errors.

## 2018-09-01 NOTE — Progress Notes (Signed)
CDS notified at [phone]. Spoke with Molly Maduro who stated that he would have Dellia Cloud call me. Spoke with Dellia Cloud at [phone] who stated that someone would come do a site visit today.   CDS referral number [id number]

## 2018-09-01 NOTE — Progress Notes (Signed)
Report given to Merry Proud, RN, care handoff at this time.

## 2018-09-01 NOTE — Consult Note (Signed)
Cardiology Consultation:   Patient ID: Suzanne Rocha; [id number]; 11-Sep-1955   Admit date: 09/08/2018 Date of Consult: 09/01/2018  Primary Care Provider: System, Pcp Not In Primary Cardiologist: [facility]   Patient Profile:   Suzanne Rocha is a 63 y.o. female with a hx of nonobstructive CAD by [place] in 12/2016, HFrEF secondary to nonischemic cardiomyopathy with subsequent normalization of EF, family history of sudden cardiac death in her brother, hyperlipidemia with significant hypertriglyceridemia, congenital sacral meningocele, breast cancer, diabetes type 2, hypertension, anemia, depression, obesity, and tobacco abuse who is being seen today for the evaluation of cardiac arrest at the request of Dr. Jannifer Franklin.  History of Present Illness:   Suzanne Rocha previously underwent echo in 11/2016 for orthopnea/dyspnea which showed an EF of 35 to 40% with mild LVH.  Subsequent LHC for further evaluation given risk factors nonobstructive CAD with normal LV systolic function with a visually estimated EF of 55 to 60%.  Most recent echo from 04/2017 showed full recovery of LV systolic function with medical management being advised.  She was most recently seen by her primary cardiologist at [facility] on 08/06/2018 for follow-up of hypertriglyceridemia that had subsequently improved with addition of fenofibrate to her Crestor.  However, she did note bilateral lower extremity cramping following addition of fenofibrate.  She continues to smoke though had decreased her tobacco use to 8 cigarettes/day.  Remaining of the history is taken from prior documentation given patient is intubated and no family present this morning.  Patient was reportedly in her usual state of health on 3/8 until the evening hours when she experienced acute onset of shortness of breath.  She called EMS secondary to respiratory distress.  Upon EMS arrival, she was found to be unresponsive with reported asystole arrest.  Rhythm strips were not available for  review.  She had 2 rounds of CPR with 2 doses of epi being given in the ED followed by [place].  In the ED, there was report of emesis with concerns for possible aspiration pneumonia.  She was hypotensive requiring vasopressor support.  She has been started on empiric IV antibiotics as well as IV steroids.  She remains intubated and sedated with vasopressor support.  Initial troponin 0 0.09 trending to 0.41.  WBC 17.5 trending to 39.5.  Magnesium 2.2, potassium 5.7 trended to 5.2, serum creatinine 1.11 trending to 0.93.  CTA chest negative for PE with right upper lobe consolidation concerning for pneumonia.  On arrival to the ICU, mild clonus has been noted.  She has received loading dose of Versed with plans for Versed drip.  Past Medical History:  Diagnosis Date  . CHF (congestive heart failure) ([place])   . COPD (chronic obstructive pulmonary disease) ([place])   . Diabetes mellitus without complication ([place])   . Hypertension     Past Surgical History:  Procedure Laterality Date  . ABDOMINAL HYSTERECTOMY    . THROAT SURGERY       Home Meds: Prior to Admission medications   Not on File    Inpatient Medications: Scheduled Meds: . aspirin  300 mg Rectal NOW  . budesonide (PULMICORT) nebulizer solution  0.5 mg Nebulization BID  . chlorhexidine gluconate (MEDLINE KIT)  15 mL Mouth Rinse BID  . fentaNYL (SUBLIMAZE) injection  50 mcg Intravenous Once  . ipratropium-albuterol  3 mL Nebulization Q4H  . mouth rinse  15 mL Mouth Rinse 10 times per day  . methylPREDNISolone (SOLU-MEDROL) injection  60 mg Intravenous Q6H  . sodium chloride flush  10-40 mL Intracatheter Q12H   Continuous Infusions: . sodium chloride Stopped (09/01/18 8546)  . ceFEPime (MAXIPIME) IV    . DOPamine    . famotidine (PEPCID) IV    . fentaNYL infusion INTRAVENOUS 50 mcg/hr (09/01/18 0645)  . heparin 900 Units/hr (09/01/18 0645)  . insulin 3.2 Units/hr (09/01/18 0724)  . metronidazole 100 mL/hr at 09/01/18 0645  .  midazolam    . norepinephrine (LEVOPHED) Adult infusion 6 mcg/min (09/01/18 0645)  . vancomycin 200 mL/hr at 09/01/18 0645   PRN Meds: acetaminophen, fentaNYL, midazolam, ondansetron (ZOFRAN) IV, sodium chloride flush  Allergies:   Allergies  Allergen Reactions  . Erythromycin   . Penicillins Hives and Swelling    Social History:   Social History   Socioeconomic History  . Marital status: Married    Spouse name: Not on file  . Number of children: Not on file  . Years of education: Not on file  . Highest education level: Not on file  Occupational History  . Not on file  Social Needs  . Financial resource strain: Not on file  . Food insecurity:    Worry: Not on file    Inability: Not on file  . Transportation needs:    Medical: Not on file    Non-medical: Not on file  Tobacco Use  . Smoking status: Current Every Day Smoker  . Smokeless tobacco: Never Used  Substance and Sexual Activity  . Alcohol use: Yes  . Drug use: Not on file  . Sexual activity: Not on file  Lifestyle  . Physical activity:    Days per week: Not on file    Minutes per session: Not on file  . Stress: Not on file  Relationships  . Social connections:    Talks on phone: Not on file    Gets together: Not on file    Attends religious service: Not on file    Active member of club or organization: Not on file    Attends meetings of clubs or organizations: Not on file    Relationship status: Not on file  . Intimate partner violence:    Fear of current or ex partner: Not on file    Emotionally abused: Not on file    Physically abused: Not on file    Forced sexual activity: Not on file  Other Topics Concern  . Not on file  Social History Narrative  . Not on file     Family History:   Family History  Problem Relation Age of Onset  . Diabetes Mother   . Heart failure Mother   . Hypertension Mother   . CAD Father   . Sudden Cardiac Death Brother   . Hypertension Brother     ROS:  Review  of Systems  Unable to perform ROS: Intubated      Physical Exam/Data:   Vitals:   09/01/18 0500 09/01/18 0600 09/01/18 0645 09/01/18 0700  BP: 107/90 (!) 82/50  106/83  Pulse:  91 89 84  Resp: 16 16 (!) 22 (!) 25  Temp: (!) 94.8 F (34.9 C)  (!) 94.3 F (34.6 C)   TempSrc:   Esophageal   SpO2:  96% 98% 99%  Weight: 98.4 kg     Height:        Intake/Output Summary (Last 24 hours) at 09/01/2018 0727 Last data filed at 09/01/2018 0645 Gross per 24 hour  Intake 1526.65 ml  Output 450 ml  Net 1076.65 ml   [employer]  09/01/18 0008 09/01/18 0500  Weight: 99.8 kg 98.4 kg   Body mass index is 39.68 kg/m.   Physical Exam: General: Intubated and sedated, critically ill-appearing. Head: Normocephalic, atraumatic, sclera non-icteric, no xanthomas, nares without discharge.  Neck: Negative for carotid bruits. JVD difficult to assess secondary to respiratory apparatus. Lungs: Diminished breath sounds bilaterally, especially right upper lobe, intubated. Heart: RRR with S1 S2. No murmurs, rubs, or gallops appreciated. Abdomen: Soft, non-tender, non-distended with normoactive bowel sounds. No hepatomegaly. No rebound/guarding. No obvious abdominal masses. Msk:  Strength and tone appear normal for age. Extremities: No clubbing or cyanosis. No edema. Distal pedal pulses are 2+ and equal bilaterally. Neuro: Intubated and sedated. Psych: Intubated and sedated.   EKG:  The EKG was personally reviewed and demonstrates: NSR, 94 bpm, RBBB Telemetry:  Telemetry was personally reviewed and demonstrates: NSR  Weights: [employer]   09/01/18 0008 09/01/18 0500  Weight: 99.8 kg 98.4 kg    Relevant CV Studies: 2D echo 04/2017: Result Narrative   Technically difficult study due to chest wall/lung interference  Normal left ventricular systolic function, ejection fraction > 55%  Degenerative mitral valve disease  Mitral annular calcification  Normal right ventricular systolic  function    LHC 12/2016: Coronary Angiography  Dominance: Right  Left Main: The left main coronary artery (LMCA) is a large-caliber vessel  that originates from the left coronary sinus. It bifurcates into the left  anterior descending (LAD) and left circumflex (LCx) arteries. There is no  angiographic evidence of significant disease in the LMCA.  LAD: The LAD is a large-caliber vessel that gives off 4 diagonal (D)  branches before it wraps around the apex. D1 is a large-caliber vessel. D2  is a small-caliber vessel. The remaining diagonals are diminutive. There  is mild non-obstructive plaque in the LAD.  Left Circumflex: The LCx is a large-caliber vessel that gives off 3  obtuse marginal (OM) branches and then continues as a small vessel in the  AV groove. OM1 is a very small-caliber vessel. OM2 is a large-caliber  branching vessel with a small intramyocardial segment. OM3 is a tiny  vessel. There is mild plaque in the LCx system. .  Right Coronary: The right coronary artery (RCA) is a large-caliber vessel  originating from the right coronary sinus. It bifurcates distally into the  posterior descending artery (PDA) and a posterolateral (PL) branch  consistent with a right dominant system. There is no angiographic evidence  of significant disease in the RCA.  Findings: 1. Non-flow limiting coronary artery disease. 2. Normal LV Function. EF estimated 55 %. LVEDP 11 mm Hg  Recommendations: Medical Management  Aggressive secondary prevention  ASA 81 mg daily indefinitely    2D echo 11/2016: Result Narrative   Technically difficult study due to chest wall/lung interference  Left ventricular hypertrophy - mild  Moderately decreased left ventricular systolic function, ejection  fraction 35 to 40%  Degenerative mitral valve disease  Mitral annular calcification  Aortic sclerosis  Normal right ventricular systolic function    Laboratory  Data:  Chemistry Recent Labs  Lab 09/17/2018 2357 09/01/18 0411  NA 141 138  K 5.7* 5.2*  CL 108 109  CO2 18* 24  GLUCOSE 307* 399*  BUN 11 16  CREATININE 1.11* 0.93  CALCIUM 8.7* 8.6*  GFRNONAA 54* >60  GFRAA >60 >60  ANIONGAP 15 5    Recent Labs  Lab 09/14/2018 2357  PROT 6.1*  ALBUMIN 3.7  AST 135*  ALT 84*  ALKPHOS  74  BILITOT 0.6   Hematology Recent Labs  Lab 09/02/2018 2357 09/01/18 0411  WBC 17.5* 39.5*  RBC 4.62 4.28  HGB 14.8 13.9  HCT 46.9* 41.9  MCV 101.5* 97.9  MCH 32.0 32.5  MCHC 31.6 33.2  RDW 12.1 12.3  PLT 234 243   Cardiac Enzymes Recent Labs  Lab 09/09/2018 2357 09/01/18 0411  TROPONINI 0.09* 0.41*   No results for input(s): TROPIPOC in the last 168 hours.  BNPNo results for input(s): BNP, PROBNP in the last 168 hours.  DDimer No results for input(s): DDIMER in the last 168 hours.  Radiology/Studies:  Dg Abdomen 1 View  Result Date: 09/01/2018 IMPRESSION: Enteric tube tip is in the left upper quadrant consistent with location in the upper stomach. Electronically Signed   By: Lucienne Capers M.D.   On: 09/01/2018 00:50   Ct Head Wo Contrast  Result Date: 09/01/2018 IMPRESSION: No acute intracranial abnormalities. Electronically Signed   By: Lucienne Capers M.D.   On: 09/01/2018 01:28   Ct Angio Chest Pe W And/or Wo Contrast  Result Date: 09/01/2018 IMPRESSION: No evidence of significant pulmonary embolus. Consolidation and volume loss in the right upper lung with patchy airspace infiltrates throughout the right lung likely representing pneumonia. Electronically Signed   By: Lucienne Capers M.D.   On: 09/01/2018 01:31   Dg Chest Port 1 View  Result Date: 09/01/2018 IMPRESSION: 1. New central line without complicating feature. 2. Improving right upper lobe aeration. Electronically Signed   By: Monte Fantasia M.D.   On: 09/01/2018 05:56   Dg Chest Portable 1 View  Result Date: 09/01/2018 IMPRESSION: 1. Endotracheal tube is low, measuring  about 8 mm above the carina. Enteric tube tip in the left upper quadrant consistent with location in the upper stomach. Lungs are clear. 2. No pneumothorax seen. Electronically Signed   By: Lucienne Capers M.D.   On: 09/01/2018 00:49    Assessment and Plan:   1.  Cardiopulmonary arrest with likely septic shock: -Appears to be primary respiratory etiology secondary to possible COPD exacerbation complicated by aspiration pneumonia leading to cardiac arrest -Remains in debated with ventilatory support per PCCM -Vasopressor support per PCCM -Cannot exclude cardiogenic shock -Supportive care per PCCM  2.  Elevated troponin/nonobstructive CAD: -Uncertain if patient was having anginal symptoms leading up to her event -Recent cardiac cath in 12/2016 demonstrated nonobstructive disease as outlined above -Troponin was trended to 0.41, continue to cycle until peak and plateau -Can remain on heparin drip for now pending troponin trend and echo -Check echo -ASA  3.  HFrEF secondary to nonischemic cardiomyopathy with subsequent normalization of EF: -She does not appear grossly volume overloaded -Evidence-based heart failure therapy on hold as the patient is requiring vasopressor support -Resume evidence-based therapy as vitals allow -Check echo  4.  PE COPD/pneumonia: -Per PCCM and IM  5.  Hyperlipidemia/hypertriglyceridemia: -Follow-up with primary cardiologist    For questions or updates, please contact [place] Please consult [email or web address] for contact info under Cardiology/STEMI.   Signed, Christell Faith, PA-C [place] Pager: [phone] 09/01/2018, 7:27 AM

## 2018-09-01 NOTE — ED Notes (Signed)
ED TO INPATIENT HANDOFF REPORT  ED Nurse Name and Phone #: Woody Seller Name/Age/Gender Suzanne Rocha 63 y.o. female Room/Bed: ED06A/ED06A  Code Status   Code Status: Not on file  Home/SNF/Other unknown  Is this baseline  Triage Complete: Triage complete  Chief Complaint CPR  Triage Note Patient coming [facility] from home. Call initially for respiratory distress. Upon EMS arrival they were informed that patient was assisted to floor after losing consciousness - patient unresponsive at EMS arrival.   Initial rhythm asystole. Patient given 2 epi's and 20 minutes of CPR. ROSC returned.   Patient's BP intinially 40/20. Dopamine initiated. Following BPs were 90/50, and 150/114.  King's airway on arrival   Allergies Allergies  Allergen Reactions  . Erythromycin   . Penicillins     Level of Care/Admitting Diagnosis ED Disposition    ED Disposition Condition Comment   Admit  Hospital Area: [facility] [[id number]]  Level of Care: ICU [6]  Diagnosis: Cardiac arrest (HCC) [427.5.ICD-9-CM]  Admitting Physician: Oralia Manis [[id number]]  Attending Physician: Oralia Manis [phone]  Estimated length of stay: past midnight tomorrow  Certification:: I certify this patient will need inpatient services for at least 2 midnights  PT Class (Do Not Modify): Inpatient [101]  PT Acc Code (Do Not Modify): Private [1]       B Medical/Surgery History Past Medical History:  Diagnosis Date  . CHF (congestive heart failure) (HCC)   . COPD (chronic obstructive pulmonary disease) (HCC)   . Diabetes mellitus without complication (HCC)   . Hypertension    Past Surgical History:  Procedure Laterality Date  . ABDOMINAL HYSTERECTOMY    . THROAT SURGERY       A IV Location/Drains/Wounds Patient Lines/Drains/Airways Status   Active Line/Drains/Airways    Name:   Placement date:   Placement time:   Site:   Days:   Peripheral IV Sep 30, 2018 Right Antecubital   2018-09-30     2354    Antecubital   1   Peripheral IV 09/01/18 Right Other (Comment)   09/01/18    0000    Other (Comment)   less than 1   Peripheral IV 09/01/18 Left Antecubital   09/01/18    0041    Antecubital   less than 1   Peripheral IV 09/01/18 Right Hand   09/01/18    0041    Hand   less than 1   NG/OG Tube Orogastric 18 Fr. Right mouth Xray   09/01/18    0015    Right mouth   less than 1   Urethral Catheter Brittney Sparks, NTII Non-latex 16 Fr.   09/01/18    0059    Non-latex   less than 1   Airway 7.5 mm   09/01/18    0007     less than 1   Intraosseous Line 09/01/18 Tibia   09/01/18    -    Right   less than 1   Intraosseous Line 09/01/18   09/01/18    -    Left;Proximal   less than 1          Intake/Output Last 24 hours No intake or output data in the 24 hours ending 09/01/18 0159  Labs/Imaging Results for orders placed or performed during the hospital encounter of 30-Sep-2018 (from the past 48 hour(s))  CBC with Differential     Status: Abnormal   Collection Time: 2018/09/30 11:57 PM  Result Value Ref Range   WBC 17.5 (H)  4.0 - 10.5 K/uL   RBC 4.62 3.87 - 5.11 MIL/uL   Hemoglobin 14.8 12.0 - 15.0 g/dL   HCT 27.5 (H) 17.0 - 01.7 %   MCV 101.5 (H) 80.0 - 100.0 fL   MCH 32.0 26.0 - 34.0 pg   MCHC 31.6 30.0 - 36.0 g/dL   RDW 49.4 49.6 - 75.9 %   Platelets 234 150 - 400 K/uL   nRBC 0.2 0.0 - 0.2 %   Neutrophils Relative % 36 %   Neutro Abs 6.3 1.7 - 7.7 K/uL   Lymphocytes Relative 52 %   Lymphs Abs 9.2 (H) 0.7 - 4.0 K/uL   Monocytes Relative 3 %   Monocytes Absolute 0.6 0.1 - 1.0 K/uL   Eosinophils Relative 2 %   Eosinophils Absolute 0.3 0.0 - 0.5 K/uL   Basophils Relative 1 %   Basophils Absolute 0.1 0.0 - 0.1 K/uL   WBC Morphology TOXIC GRANULATION    RBC Morphology MORPHOLOGY UNREMARKABLE    Smear Review PLATELET CLUMPS NOTED ON SMEAR, UNABLE TO ESTIMATE    Immature Granulocytes 6 %   Abs Immature Granulocytes 1.05 (H) 0.00 - 0.07 K/uL   Abnormal Lymphocytes Present PRESENT     Smudge Cells PRESENT     Comment: Performed at [facility], [address] [place], [place] [address]  Troponin I - ONCE - STAT     Status: Abnormal   Collection Time: 09/03/2018 11:57 PM  Result Value Ref Range   Troponin I 0.09 (HH) <0.03 ng/mL    Comment: CRITICAL RESULT CALLED TO, READ BACK BY AND VERIFIED WITH JENNA Mystie Ormand AT 0046 ON 09/01/2018 [facility]. Performed at [facility], [address] [place], [place] [address]   Comprehensive metabolic panel     Status: Abnormal   Collection Time: 09/03/18 11:57 PM  Result Value Ref Range   Sodium 141 135 - 145 mmol/L   Potassium 5.7 (H) 3.5 - 5.1 mmol/L   Chloride 108 98 - 111 mmol/L   CO2 18 (L) 22 - 32 mmol/L   Glucose, Bld 307 (H) 70 - 99 mg/dL   BUN 11 6 - 20 mg/dL   Creatinine, Ser 3.57 (H) 0.44 - 1.00 mg/dL   Calcium 8.7 (L) 8.9 - 10.3 mg/dL   Total Protein 6.1 (L) 6.5 - 8.1 g/dL   Albumin 3.7 3.5 - 5.0 g/dL   AST 017 (H) 15 - 41 U/L   ALT 84 (H) 0 - 44 U/L   Alkaline Phosphatase 79 38 - 126 U/L   Total Bilirubin 0.6 0.3 - 1.2 mg/dL   GFR calc non Af Amer 54 (L) >60 mL/min   GFR calc Af Amer >60 >60 mL/min   Anion gap 15 5 - 15    Comment: Performed at [facility], [address] [place], [place] [address]  Lactic acid, plasma     Status: Abnormal   Collection Time: 09-03-18 11:57 PM  Result Value Ref Range   Lactic Acid, Venous >11.0 (HH) 0.5 - 1.9 mmol/L    Comment: CRITICAL RESULT CALLED TO, READ BACK BY AND VERIFIED WITH JENNA Myrla Malanowski AT 0037 ON 09/01/2018 [facility]. Performed at [facility], [address] [place], [place] [address]   Blood gas, venous     Status: Abnormal (Preliminary result)   Collection Time: 09/01/18 12:11 AM  Result Value Ref Range   FIO2 0.60    Delivery systems VENTILATOR    Mode ASSIST CONTROL    VT 550 mL  LHR 10 resp/min   Peep/cpap 5.0 cm H20   pH, Ven <6.900 (LL) 7.250 - 7.430    Comment: CRITICAL RESULT, NOTIFIED PHYSICIAN VERONESE, MD     pCO2, Ven 119 (HH) 44.0 - 60.0 mmHg    Comment: CRITICAL RESULT, NOTIFIED PHYSICIAN   pO2, Ven 130.0 (H) 32.0 - 45.0 mmHg   Bicarbonate PENDING 20.0 - 28.0 mmol/L   O2 Saturation PENDING %   Patient temperature 37.0    Collection site VEIN    Sample type VENOUS     Comment: Performed at [facility], [address], [place], [place] [address]   Dg Abdomen 1 View  Result Date: 09/01/2018 CLINICAL DATA:  OG tube placement. EXAM: ABDOMEN - 1 VIEW COMPARISON:  None. FINDINGS: Enteric tube tip is in the left upper quadrant consistent with location in the upper stomach. Gas-filled nondistended transverse colon. IMPRESSION: Enteric tube tip is in the left upper quadrant consistent with location in the upper stomach. Electronically Signed   By: Burman Nieves M.D.   On: 09/01/2018 00:50   Ct Head Wo Contrast  Result Date: 09/01/2018 CLINICAL DATA:  Patient loss consciousness and fell to the floor. Unresponsive upon EMS arrival. EXAM: CT HEAD WITHOUT CONTRAST TECHNIQUE: Contiguous axial images were obtained from the base of the skull through the vertex without intravenous contrast. COMPARISON:  None. FINDINGS: Brain: No evidence of acute infarction, hemorrhage, hydrocephalus, extra-axial collection or mass lesion/mass effect. Vascular: No hyperdense vessel or unexpected calcification. Skull: Calvarium appears intact. Sinuses/Orbits: Mild mucosal thickening in the paranasal sinuses. No acute air-fluid levels. Mastoid air cells are clear. Other: None. IMPRESSION: No acute intracranial abnormalities. Electronically Signed   By: Burman Nieves M.D.   On: 09/01/2018 01:28   Ct Angio Chest Pe W And/or Wo Contrast  Result Date: 09/01/2018 CLINICAL DATA:  Patient lost consciousness and fell. EXAM: CT ANGIOGRAPHY CHEST WITH CONTRAST TECHNIQUE: Multidetector CT imaging of the chest was performed using the standard protocol during bolus administration of intravenous contrast. Multiplanar CT image  reconstructions and MIPs were obtained to evaluate the vascular anatomy. CONTRAST:  48mL OMNIPAQUE IOHEXOL 350 MG/ML SOLN COMPARISON:  None. FINDINGS: Cardiovascular: Motion artifact limits examination. There is good opacification of the central and segmental pulmonary arteries. No focal filling defects. No evidence of significant pulmonary embolus. Normal heart size. No pericardial effusions. Normal caliber thoracic aorta with scattered calcifications. Mediastinum/Nodes: Esophagus is decompressed with an enteric tube in place. Endotracheal tube in place with tip above the carina. No significant lymphadenopathy. Lungs/Pleura: Consolidation and volume loss in the right upper lung with patchy airspace infiltrates elsewhere in the right lung. Appearance likely represents pneumonia. Airways are patent. No pleural effusions. No pneumothorax. Upper Abdomen: Enteric tube tip is in the body of the stomach along the greater curvature. Musculoskeletal: Degenerative changes in the spine. No destructive bone lesions. Review of the MIP images confirms the above findings. IMPRESSION: No evidence of significant pulmonary embolus. Consolidation and volume loss in the right upper lung with patchy airspace infiltrates throughout the right lung likely representing pneumonia. Electronically Signed   By: Burman Nieves M.D.   On: 09/01/2018 01:31   Dg Chest Portable 1 View  Result Date: 09/01/2018 CLINICAL DATA:  Respiratory distress. Post intubation, line placement, OG tube placement EXAM: PORTABLE CHEST 1 VIEW COMPARISON:  None. FINDINGS: The endotracheal tube is low, measuring only about 8 mm above the carina. Enteric tube tip is shown in the left upper quadrant consistent with location in the upper stomach.  No radiopaque central venous catheters are identified. Heart size and pulmonary vascularity are normal. Lungs are clear and expanded. No blunting of costophrenic angles. No pneumothorax. Mediastinal contours appear intact.  IMPRESSION: 1. Endotracheal tube is low, measuring about 8 mm above the carina. Enteric tube tip in the left upper quadrant consistent with location in the upper stomach. Lungs are clear. 2. No pneumothorax seen. Electronically Signed   By: Burman Nieves M.D.   On: 09/01/2018 00:49    Pending Labs Unresulted Labs (From admission, onward)    Start     Ordered   09/01/18 0152  Blood gas, arterial  Once,   STAT     09/01/18 0151   09/01/18 0143  Urinalysis, Routine w reflex microscopic  ONCE - STAT,   STAT     09/01/18 0143   09/01/18 0112  Influenza panel by PCR (type A & B)  (Influenza PCR Panel)  Once,   STAT     09/01/18 0111   09/01/18 0104  Blood culture (routine x 2)  BLOOD CULTURE X 2,   STAT     09/01/18 0103   09/01/18 0039  Urinalysis, Complete w Microscopic  Once,   STAT     09/01/18 0038   09/01/18 0039  Urine Drug Screen, Qualitative ([facility] only)  Once,   STAT     09/01/18 0038   09/01/18 0011  Lactic acid, plasma  Now then every 2 hours,   STAT     09/01/18 0010   Signed and Held  HIV antibody (Routine Testing)  Once,   R     Signed and Held   Signed and Held  CBC  (enoxaparin (LOVENOX)    CrCl >/= 30 ml/min)  Once,   R    Comments:  Baseline for enoxaparin therapy IF NOT ALREADY DRAWN.  Notify MD if PLT < 100 K.    Signed and Held   Signed and Held  Creatinine, serum  (enoxaparin (LOVENOX)    CrCl >/= 30 ml/min)  Once,   R    Comments:  Baseline for enoxaparin therapy IF NOT ALREADY DRAWN.    Signed and Held   Signed and Held  Creatinine, serum  (enoxaparin (LOVENOX)    CrCl >/= 30 ml/min)  Weekly,   R    Comments:  while on enoxaparin therapy    Signed and Held          Vitals/Pain Today's Vitals   09/01/18 0125 09/01/18 0130 09/01/18 0133 09/01/18 0136  BP: 91/78 93/78 (!) 85/70 (!) 88/73  Pulse: 88 85 87 90  Resp: (!) 45 (!) 21 16 16   Temp:      TempSrc:      SpO2: 100% 100% 100% 100%  Weight:        Isolation Precautions Droplet  precaution  Medications Medications  dexmedetomidine (PRECEDEX) 400 MCG/100ML (4 mcg/mL) infusion (0.4 mcg/kg/hr  99.8 kg Intravenous New Bag/Given 09/01/18 0025)  norepinephrine (LEVOPHED) 4mg  in premix infusion (9 mcg/min Intravenous Rate/Dose Change 09/01/18 0150)  DOPamine (INTROPIN) 3.2-5 MG/ML-% infusion (has no administration in time range)  ceFEPIme (MAXIPIME) 1 g in sodium chloride 0.9 % 100 mL IVPB (has no administration in time range)  vancomycin (VANCOCIN) IVPB 1000 mg/200 mL premix (has no administration in time range)  clindamycin (CLEOCIN) IVPB 600 mg (has no administration in time range)  etomidate (AMIDATE) injection 30 mg (30 mg Intravenous Given 09/01/18 0005)  rocuronium (ZEMURON) injection 120 mg (120 mg Intravenous Given  09/01/18 0005)  calcium chloride injection 1 g (1 g Intravenous Given 09/01/18 0004)  methylPREDNISolone sodium succinate (SOLU-MEDROL) 125 mg/2 mL injection 125 mg (125 mg Intravenous Given 09/01/18 0029)  ipratropium-albuterol (DUONEB) 0.5-2.5 (3) MG/3ML nebulizer solution 3 mL (3 mLs Nebulization Given 09/01/18 0026)  ipratropium-albuterol (DUONEB) 0.5-2.5 (3) MG/3ML nebulizer solution 3 mL (3 mLs Nebulization Given 09/01/18 0026)  ipratropium-albuterol (DUONEB) 0.5-2.5 (3) MG/3ML nebulizer solution 3 mL (3 mLs Nebulization Given 09/01/18 0026)  sodium chloride 0.9 % bolus 1,000 mL (1,000 mLs Intravenous New Bag/Given 09/01/18 0009)  sodium chloride 0.9 % bolus 1,000 mL (1,000 mLs Intravenous New Bag/Given 09/01/18 0034)  sodium bicarbonate injection 100 mEq (100 mEq Intravenous Given 09/01/18 0054)  insulin aspart (novoLOG) injection 10 Units (10 Units Intravenous Given 09/01/18 0151)  dextrose 50 % solution 25 mL (25 mLs Intravenous Given 09/01/18 0148)  furosemide (LASIX) injection 20 mg (20 mg Intravenous Given 09/01/18 0154)  iohexol (OMNIPAQUE) 350 MG/ML injection 75 mL (75 mLs Intravenous Contrast Given 09/01/18 0115)         Focused  Assessments    R Recommendations: See Admitting Provider Note  Report given to:   Additional Notes:

## 2018-09-01 NOTE — Progress Notes (Signed)
CODE SEPSIS - PHARMACY COMMUNICATION  **Broad Spectrum Antibiotics should be administered within 1 hour of Sepsis diagnosis**  Time Code Sepsis Called/Page Received: @ 0143  Antibiotics Ordered: Cefepime, vancomycin   Time of 1st antibiotic administration: @ 0219  Additional action taken by pharmacy: Spoke with Victorino Dike, RN @ [phone] about starting Abx.   Gardner Candle, PharmD, BCPS Clinical Pharmacist 09/01/2018 2:26 AM

## 2018-09-01 NOTE — Progress Notes (Signed)
*  PRELIMINARY RESULTS* Echocardiogram 2D Echocardiogram has been performed.  Suzanne Rocha 09/01/2018, 9:52 AM

## 2018-09-01 NOTE — Progress Notes (Signed)
Pharmacy Antibiotic Note  Suzanne Rocha is a 63 y.o. female admitted on 2018/09/15 with cardiopulmonary arrest with possible septic shock. Patient currently undergoing targeted temperature management. Patient with likely right upper lobe pneumonia. Pharmacy has been consulted for cefepime dosing.  Plan: Vancomycin discontinued this am secondary to negative MRSA PCR.   Continue cefepime 2g IV Q12hr.   Continue metronidazole 500mg  IV Q8hr.   Height: 5\' 2"  (157.5 cm) Weight: 216 lb 14.9 oz (98.4 kg) IBW/kg (Calculated) : 50.1  Temp (24hrs), Avg:95 F (35 C), Min:92.8 F (33.8 C), Max:97.5 F (36.4 C)  Recent Labs  Lab 09-15-18 2357 09/01/18 0203 09/01/18 0411 09/01/18 0422  WBC 17.5*  --  39.5*  --   CREATININE 1.11*  --  0.93  --   LATICACIDVEN >11.0* 6.6*  --  2.3*    Estimated Creatinine Clearance: 70.5 mL/min (by C-G formula based on SCr of 0.93 mg/dL).    Allergies  Allergen Reactions  . Clindamycin Hives and Swelling  . Macrolides And Ketolides Anaphylaxis  . Erythromycin   . Penicillins Hives and Swelling    Antimicrobials this admission: Clindamycin 3/9 x 1 Vancomycin 3/9 x 1  Metronidazole 3/9 >>  Cefepime 3/9 >>   Dose adjustments this admission: N/A  Microbiology results: 3/9 BCx: no growth < 12 hours  3/9 Cdiff antigen/toxin: negative  3/9 GI PCR: negative  3/9 MRSA PCR: negative   Thank you for allowing pharmacy to be a part of this patient's care.  Simpson,Michael L 09/01/2018 1:45 PM

## 2018-09-01 NOTE — Progress Notes (Signed)
Pastoral Care Visit    09/01/18 0100  Clinical Encounter Type  Visited With Patient;Family;Patient and family together;Health care provider  Visit Type Initial;Spiritual support;Critical Care;ED;Code  Referral From Nurse  Consult/Referral To Chaplain  Spiritual Encounters  Spiritual Needs Prayer;Emotional  Stress Factors  Patient Stress Factors Not reviewed  Family Stress Factors Health changes;Loss of control   Pt was nonresponsive due to medial episode.  Family was present and supportive.  Mirna Mires escorted husband and daughter to pt bedside in ED.  Later assisted family moving to ICU waiting room. Let attending PA know that family is in ICU waiting room.  Checked in with family to let them know pt is still in ED and ICU team will notify them once pt is moved up. Would be good for addtl chap visits to support family, if poss. Husband is notably stressed. He remarked that pt would not want to be on life support.  Chap reminded him know that according to MD this is only temporary but will give medical team understanding of where pt is is physically before making further decisions in the coming days.   Milinda Antis, [address]

## 2018-09-01 NOTE — Progress Notes (Signed)
8250Dellia Rocha from [facility] called back and stated that he looked through patient's chart further and found that pt has a hx of cervical CA and recently resolved breast CA, which rules her out for organ donation. [name] stated to call back with cardiac time of death if that occurred as patient may be a candidate for eye donation.

## 2018-09-01 NOTE — Progress Notes (Signed)
Initial Nutrition Assessment  DOCUMENTATION CODES:   Obesity unspecified  INTERVENTION:   If tube feeds initiated, recommend:  Vital HP @ goal rate of 39ml/hr  Free water flushes 40ml q4 hours to maintain tube patency   Regimen provides 1200kcal/day, 105g/day protein, 1151ml/day free water  Liquid MVI daily via tube  NUTRITION DIAGNOSIS:   Inadequate oral intake related to inability to eat(pt sedated and ventilated ) as evidenced by NPO status.  GOAL:   Provide needs based on ASPEN/SCCM guidelines  MONITOR:   Vent status, Labs, Weight trends, Skin, I & O's  REASON FOR ASSESSMENT:   Ventilator    ASSESSMENT:   63 y/o female with h/o cervical and breast cancer, COPD, CHF admitted with an out of hospital cardiac arrest.     Pt sedated and ventilated. No family at bedside. Per chart, pt with weight gain pta. OGT in place. Flex seal in place; pt with large watery BM earlier today. Pt on cooling protocol. No plans for tfs today.   Medications reviewed and include: aspirin, heparin, solu-medrol, cefepime, pepcid, fentanyl, insulin CRI, LRS @50ml /hr, metronidazole, levophed, versed   Labs reviewed: K 5.2(H), P 5.4(H),  Wbc- 39.5(H) cbgs- 381, 367, 318, 276, 246 x 24 hrs  Patient is currently intubated on ventilator support MV: 8.9 L/min Temp (24hrs), Avg:94.9 F (34.9 C), Min:92.8 F (33.8 C), Max:97.5 F (36.4 C)  Propofol: none   MAP- >65mmHg  UOP- x 24 hrs  NUTRITION - FOCUSED PHYSICAL EXAM:    Most Recent Value  Orbital Region  No depletion  Upper Arm Region  No depletion  Thoracic and Lumbar Region  No depletion  Buccal Region  No depletion  Temple Region  No depletion  Clavicle Bone Region  No depletion  Clavicle and Acromion Bone Region  No depletion  Scapular Bone Region  No depletion  Dorsal Hand  No depletion  Patellar Region  No depletion  Anterior Thigh Region  No depletion  Posterior Calf Region  No depletion  Edema (RD Assessment)   None  Hair  Reviewed  Eyes  Reviewed  Mouth  Reviewed  Skin  Reviewed  Nails  Reviewed     Diet Order:   Diet Order    None     EDUCATION NEEDS:   No education needs have been identified at this time  Skin:  Skin Assessment: Reviewed RN Assessment  Last BM:  3/9- type 7  Height:   Ht Readings from Last 1 Encounters:  09/01/18 5\' 2"  (1.575 m)    Weight:   Wt Readings from Last 1 Encounters:  09/01/18 98.4 kg    Ideal Body Weight:  50 kg  BMI:  Body mass index is 39.68 kg/m.  Estimated Nutritional Needs:   Kcal:  1082-1378kcal/day   Protein:  >100g/day   Fluid:  1.5L/day   Betsey Holiday MS, RD, LDN Pager #- [phone] Office#- [phone] After Hours Pager: [phone]

## 2018-09-01 NOTE — Progress Notes (Signed)
Patient transported to CT and back to ED room without complication.

## 2018-09-01 NOTE — Progress Notes (Signed)
followup abg obtained. vt decreased to 450. rr increased to 22 verbal order NP Tukov.

## 2018-09-01 NOTE — Consult Note (Signed)
Reason for Consult:Myoclonus Referring Physician: Alva Garnet  CC: Myoclonus  HPI: Suzanne Rocha is an 63 y.o. female who presented after arrest.  Patient began to experience shortness of breath at home on yesterday.  She developed respiratory distress and became unresponsive.  CPR was initiated bt family and EMS was called.  Patient in asystole.  It took about 20 minutes for EMS to arrive and another 30 minutes for ROSC.  On admission developed myoclonus.  Patient now intubated, sedated and cooled.    Past Medical History:  Diagnosis Date  . CHF (congestive heart failure) ([place])   . COPD (chronic obstructive pulmonary disease) ([place])   . Diabetes mellitus without complication ([place])   . Hypertension     Past Surgical History:  Procedure Laterality Date  . ABDOMINAL HYSTERECTOMY    . THROAT SURGERY      Family History  Problem Relation Age of Onset  . Diabetes Mother   . Heart failure Mother   . Hypertension Mother   . CAD Father   . Sudden Cardiac Death Brother   . Hypertension Brother     Social History:  reports that she has been smoking. She has never used smokeless tobacco. She reports current alcohol use. No history on file for drug.  Allergies  Allergen Reactions  . Clindamycin Hives and Swelling  . Macrolides And Ketolides Anaphylaxis  . Erythromycin   . Penicillins Hives and Swelling    Medications:  I have reviewed the patient's current medications. Prior to Admission:  Medications Prior to Admission  Medication Sig Dispense Refill Last Dose  . albuterol (PROVENTIL HFA;VENTOLIN HFA) 108 (90 Base) MCG/ACT inhaler Inhale 2 puffs into the lungs every 6 (six) hours as needed for wheezing.   Unknown at PRN  . amLODipine (NORVASC) 10 MG tablet Take 10 mg by mouth daily.   09/21/2018 at Unknown time  . aspirin EC 81 MG tablet Take 81 mg by mouth daily.   08/30/2018 at Unknown time  . carvedilol (COREG) 12.5 MG tablet Take 12.5 mg by mouth 2 (two) times daily.   08/30/2018 at  Unknown time  . enalapril (VASOTEC) 20 MG tablet Take 40 mg by mouth daily.   09/14/2018 at Unknown time  . famotidine (PEPCID) 20 MG tablet Take 20 mg by mouth 2 (two) times daily.   09/18/2018 at Unknown time  . fenofibrate (TRICOR) 48 MG tablet Take 48 mg by mouth daily.   Past Week at Unknown time  . metFORMIN (GLUCOPHAGE) 500 MG tablet Take 500 mg by mouth 2 (two) times daily with a meal.   09/15/2018 at Unknown time  . nitroGLYCERIN (NITROSTAT) 0.4 MG SL tablet Place 0.4 mg under the tongue every 5 (five) minutes as needed for chest pain.   Unknown at PRN  . Omega-3 Fatty Acids (FISH OIL) 1000 MG CPDR Take 2,000 mg by mouth 2 (two) times daily.   08/26/2018 at Unknown time  . omeprazole (PRILOSEC) 20 MG capsule Take 20 mg by mouth daily.   09/12/2018 at Unknown time  . rosuvastatin (CRESTOR) 20 MG tablet Take 10 mg by mouth daily.   08/29/2018 at Unknown time   Scheduled: . aspirin  300 mg Rectal NOW  . budesonide (PULMICORT) nebulizer solution  0.5 mg Nebulization BID  . chlorhexidine gluconate (MEDLINE KIT)  15 mL Mouth Rinse BID  . fentaNYL (SUBLIMAZE) injection  50 mcg Intravenous Once  . heparin injection (subcutaneous)  5,000 Units Subcutaneous Q8H  . ipratropium-albuterol  3 mL Nebulization Q6H  .  mouth rinse  15 mL Mouth Rinse 10 times per day  . methylPREDNISolone (SOLU-MEDROL) injection  40 mg Intravenous Q12H  . sodium chloride flush  10-40 mL Intracatheter Q12H    ROS: Unable to provide due to mental status  Physical Examination: Blood pressure (!) 70/58, pulse (!) 102, temperature (!) 96.1 F (35.6 C), temperature source Esophageal, resp. rate (!) 22, height '5\' 2"'$  (1.575 m), weight 98.4 kg, SpO2 95 %.  HEENT-  Normocephalic, no lesions, without obvious abnormality.  Normal external eye and conjunctiva.  Normal TM's bilaterally.  Normal auditory canals and external ears. Normal external nose, mucus membranes and septum.  Normal pharynx. Cardiovascular- S1, S2 normal, pulses  palpable throughout   Lungs- decreased breath sounds Abdomen- soft, non-tender; bowel sounds normal; no masses,  no organomegaly Extremities- no edema Lymph-no adenopathy palpable Musculoskeletal-no joint tenderness, deformity or swelling Skin-warm and dry, no hyperpigmentation, vitiligo, or suspicious lesions  Neurological Examination   Mental Status: Patient does not respond to verbal stimuli.  Does not respond to deep sternal rub.  Does not follow commands.  No verbalizations noted.  Cranial Nerves: II: patient does not respond confrontation bilaterally, pupils right 3 mm, left 3 mm,and minimally reactive bilaterally III,IV,VI: doll's response present bilaterally. V,VII: corneal reflex reduced on the right  VIII: patient does not respond to verbal stimuli IX,X: gag reflex reduced, XI: trapezius strength unable to test bilaterally XII: tongue strength unable to test Motor: Extremities flaccid throughout.  Myoclonus noted with stimulation.  No purposeful movements noted. Sensory: Does not respond to noxious stimuli in any extremity. Deep Tendon Reflexes:  Absent throughout. Plantars: Mute bilaterally Cerebellar: Unable to perform   Laboratory Studies:   Basic Metabolic Panel: Recent Labs  Lab 08/29/2018 2357 09/01/18 0411  NA 141 138  K 5.7* 5.2*  CL 108 109  CO2 18* 24  GLUCOSE 307* 399*  BUN 11 16  CREATININE 1.11* 0.93  CALCIUM 8.7* 8.6*  MG  --  2.2  PHOS  --  5.4*    Liver Function Tests: Recent Labs  Lab 09/03/2018 2357  AST 135*  ALT 84*  ALKPHOS 79  BILITOT 0.6  PROT 6.1*  ALBUMIN 3.7   No results for input(s): LIPASE, AMYLASE in the last 168 hours. No results for input(s): AMMONIA in the last 168 hours.  CBC: Recent Labs  Lab 09/16/2018 2357 09/01/18 0411  WBC 17.5* 39.5*  NEUTROABS 6.3  --   HGB 14.8 13.9  HCT 46.9* 41.9  MCV 101.5* 97.9  PLT 234 243    Cardiac Enzymes: Recent Labs  Lab 09/07/2018 2357 09/01/18 0411 09/01/18 1115   TROPONINI 0.09* 0.41* 0.45*    BNP: Invalid input(s): POCBNP  CBG: Recent Labs  Lab 09/01/18 0720 09/01/18 0827 09/01/18 0925 09/01/18 1035 09/01/18 1132  GLUCAP 381* 367* 318* 276* 246*    Microbiology: Results for orders placed or performed during the hospital encounter of 09/06/2018  Blood culture (routine x 2)     Status: None (Preliminary result)   Collection Time: 09/01/18  1:54 AM  Result Value Ref Range Status   Specimen Description BLOOD R HAND  Final   Special Requests   Final    BOTTLES DRAWN AEROBIC AND ANAEROBIC Blood Culture adequate volume   Culture   Final    NO GROWTH < 12 HOURS Performed at [facility], [address] [place], [place] [address]    Report Status PENDING  Incomplete  Blood culture (routine x 2)  Status: None (Preliminary result)   Collection Time: 09/01/18  2:03 AM  Result Value Ref Range Status   Specimen Description BLOOD L HAND  Final   Special Requests   Final    BOTTLES DRAWN AEROBIC AND ANAEROBIC Blood Culture adequate volume   Culture   Final    NO GROWTH < 12 HOURS Performed at [facility], [address] [place], [place] [address]    Report Status PENDING  Incomplete  Gastrointestinal Panel by PCR , Stool     Status: None   Collection Time: 09/01/18  4:05 AM  Result Value Ref Range Status   Campylobacter species NOT DETECTED NOT DETECTED Final   Plesimonas shigelloides NOT DETECTED NOT DETECTED Final   Salmonella species NOT DETECTED NOT DETECTED Final   Yersinia enterocolitica NOT DETECTED NOT DETECTED Final   Vibrio species NOT DETECTED NOT DETECTED Final   Vibrio cholerae NOT DETECTED NOT DETECTED Final   Enteroaggregative E coli (EAEC) NOT DETECTED NOT DETECTED Final   Enteropathogenic E coli (EPEC) NOT DETECTED NOT DETECTED Final   Enterotoxigenic E coli (ETEC) NOT DETECTED NOT DETECTED Final   Shiga like toxin producing E coli (STEC) NOT DETECTED NOT DETECTED Final    Shigella/Enteroinvasive E coli (EIEC) NOT DETECTED NOT DETECTED Final   Cryptosporidium NOT DETECTED NOT DETECTED Final   Cyclospora cayetanensis NOT DETECTED NOT DETECTED Final   Entamoeba histolytica NOT DETECTED NOT DETECTED Final   Giardia lamblia NOT DETECTED NOT DETECTED Final   Adenovirus F40/41 NOT DETECTED NOT DETECTED Final   Astrovirus NOT DETECTED NOT DETECTED Final   Norovirus GI/GII NOT DETECTED NOT DETECTED Final   Rotavirus A NOT DETECTED NOT DETECTED Final   Sapovirus (I, II, IV, and V) NOT DETECTED NOT DETECTED Final    Comment: Performed at [facility], [address] [place], [place] [address]  C Difficile Quick Screen w PCR reflex     Status: None   Collection Time: 09/01/18  4:05 AM  Result Value Ref Range Status   C Diff antigen NEGATIVE NEGATIVE Final   C Diff toxin NEGATIVE NEGATIVE Final   C Diff interpretation No C. difficile detected.  Final    Comment: Performed at [facility], [address] [place], [place] [address]  MRSA PCR Screening     Status: None   Collection Time: 09/01/18  5:31 AM  Result Value Ref Range Status   MRSA by PCR NEGATIVE NEGATIVE Final    Comment:        The GeneXpert MRSA Assay (FDA approved for NASAL specimens only), is one component of a comprehensive MRSA colonization surveillance program. It is not intended to diagnose MRSA infection nor to guide or monitor treatment for MRSA infections. Performed at [facility], [address] [place], [place] [address]     Coagulation Studies: Recent Labs    09/01/18 0411  LABPROT 15.3*  INR 1.2    Urinalysis:  Recent Labs  Lab 09/04/2018 2357 09/01/18 0405  COLORURINE YELLOW* YELLOW*  LABSPEC 1.015 1.034*  PHURINE 6.0 5.0  GLUCOSEU >=500* NEGATIVE  HGBUR MODERATE* MODERATE*  BILIRUBINUR NEGATIVE NEGATIVE  KETONESUR NEGATIVE NEGATIVE  PROTEINUR >=300* 30*  NITRITE NEGATIVE NEGATIVE  LEUKOCYTESUR NEGATIVE NEGATIVE    Lipid Panel:   No results found for: CHOL, TRIG, HDL, CHOLHDL, VLDL, LDLCALC  HgbA1C: No results found for: HGBA1C  Urine Drug Screen:      Component Value Date/Time   LABOPIA NONE DETECTED 09/16/2018 [place]  NONE DETECTED 09/11/2018 2357   LABBENZ NONE DETECTED 08/29/2018 2357   AMPHETMU NONE DETECTED 09/02/2018 2357   THCU NONE DETECTED 09/11/2018 2357   LABBARB NONE DETECTED 09/16/2018 2357    Alcohol Level: No results for input(s): ETH in the last 168 hours.  Other results: EKG: sinus rhythm at 89 bpm.  Imaging: Dg Abdomen 1 View  Result Date: 09/01/2018 CLINICAL DATA:  OG tube placement. EXAM: ABDOMEN - 1 VIEW COMPARISON:  None. FINDINGS: Enteric tube tip is in the left upper quadrant consistent with location in the upper stomach. Gas-filled nondistended transverse colon. IMPRESSION: Enteric tube tip is in the left upper quadrant consistent with location in the upper stomach. Electronically Signed   By: Lucienne Capers M.D.   On: 09/01/2018 00:50   Ct Head Wo Contrast  Result Date: 09/01/2018 CLINICAL DATA:  Patient loss consciousness and fell to the floor. Unresponsive upon EMS arrival. EXAM: CT HEAD WITHOUT CONTRAST TECHNIQUE: Contiguous axial images were obtained from the base of the skull through the vertex without intravenous contrast. COMPARISON:  None. FINDINGS: Brain: No evidence of acute infarction, hemorrhage, hydrocephalus, extra-axial collection or mass lesion/mass effect. Vascular: No hyperdense vessel or unexpected calcification. Skull: Calvarium appears intact. Sinuses/Orbits: Mild mucosal thickening in the paranasal sinuses. No acute air-fluid levels. Mastoid air cells are clear. Other: None. IMPRESSION: No acute intracranial abnormalities. Electronically Signed   By: Lucienne Capers M.D.   On: 09/01/2018 01:28   Ct Angio Chest Pe W And/or Wo Contrast  Result Date: 09/01/2018 CLINICAL DATA:  Patient lost consciousness and fell. EXAM: CT ANGIOGRAPHY CHEST WITH CONTRAST  TECHNIQUE: Multidetector CT imaging of the chest was performed using the standard protocol during bolus administration of intravenous contrast. Multiplanar CT image reconstructions and MIPs were obtained to evaluate the vascular anatomy. CONTRAST:  4m OMNIPAQUE IOHEXOL 350 MG/ML SOLN COMPARISON:  None. FINDINGS: Cardiovascular: Motion artifact limits examination. There is good opacification of the central and segmental pulmonary arteries. No focal filling defects. No evidence of significant pulmonary embolus. Normal heart size. No pericardial effusions. Normal caliber thoracic aorta with scattered calcifications. Mediastinum/Nodes: Esophagus is decompressed with an enteric tube in place. Endotracheal tube in place with tip above the carina. No significant lymphadenopathy. Lungs/Pleura: Consolidation and volume loss in the right upper lung with patchy airspace infiltrates elsewhere in the right lung. Appearance likely represents pneumonia. Airways are patent. No pleural effusions. No pneumothorax. Upper Abdomen: Enteric tube tip is in the body of the stomach along the greater curvature. Musculoskeletal: Degenerative changes in the spine. No destructive bone lesions. Review of the MIP images confirms the above findings. IMPRESSION: No evidence of significant pulmonary embolus. Consolidation and volume loss in the right upper lung with patchy airspace infiltrates throughout the right lung likely representing pneumonia. Electronically Signed   By: WLucienne CapersM.D.   On: 09/01/2018 01:31   Dg Chest Port 1 View  Result Date: 09/01/2018 CLINICAL DATA:  Encounter for central line placement EXAM: PORTABLE CHEST 1 VIEW COMPARISON:  Earlier today FINDINGS: Endotracheal tube tip between the clavicular heads and carina. Right IJ line with tip at the SVC. No pneumothorax or new mediastinal widening. The orogastric tube at least reaches the stomach. Hazy opacity at the right upper lobe which is likely improved since  earlier. IMPRESSION: 1. New central line without complicating feature. 2. Improving right upper lobe aeration. Electronically Signed   By: JMonte FantasiaM.D.   On: 09/01/2018 05:56   Dg Chest Portable 1 View  Result Date:  09/01/2018 CLINICAL DATA:  Respiratory distress. Post intubation, line placement, OG tube placement EXAM: PORTABLE CHEST 1 VIEW COMPARISON:  None. FINDINGS: The endotracheal tube is low, measuring only about 8 mm above the carina. Enteric tube tip is shown in the left upper quadrant consistent with location in the upper stomach. No radiopaque central venous catheters are identified. Heart size and pulmonary vascularity are normal. Lungs are clear and expanded. No blunting of costophrenic angles. No pneumothorax. Mediastinal contours appear intact. IMPRESSION: 1. Endotracheal tube is low, measuring about 8 mm above the carina. Enteric tube tip in the left upper quadrant consistent with location in the upper stomach. Lungs are clear. 2. No pneumothorax seen. Electronically Signed   By: Lucienne Capers M.D.   On: 09/01/2018 00:49     Assessment/Plan: 63 year old female s/p arrest with prolonged downtime.  Now with development of myoclonus.  Intubated and sedated therefore neurological examination not helpful for prognosis yet patient does not fulfill criteria for bran death.  Initial head CT reviewed and shows no acute changes.  Patient with multiple metabolic issues as well.  Due to prolonged down time and early development of myoclonus, prognosis is poor.    Recommendations: 1. Would repeat head CT in AM 2. EEG  Alexis Goodell, MD Neurology [phone] 09/01/2018, 12:10 PM

## 2018-09-02 ENCOUNTER — Inpatient Hospital Stay: Payer: Self-pay

## 2018-09-02 DIAGNOSIS — R402411 Glasgow coma scale score 13-15, in the field [EMT or ambulance]: Secondary | ICD-10-CM

## 2018-09-02 DIAGNOSIS — J9602 Acute respiratory failure with hypercapnia: Secondary | ICD-10-CM

## 2018-09-02 DIAGNOSIS — J9601 Acute respiratory failure with hypoxia: Principal | ICD-10-CM

## 2018-09-02 DIAGNOSIS — I469 Cardiac arrest, cause unspecified: Secondary | ICD-10-CM

## 2018-09-02 DIAGNOSIS — R092 Respiratory arrest: Secondary | ICD-10-CM

## 2018-09-02 DIAGNOSIS — G931 Anoxic brain damage, not elsewhere classified: Secondary | ICD-10-CM

## 2018-09-02 DIAGNOSIS — E118 Type 2 diabetes mellitus with unspecified complications: Secondary | ICD-10-CM

## 2018-09-02 DIAGNOSIS — G936 Cerebral edema: Secondary | ICD-10-CM

## 2018-09-02 DIAGNOSIS — J441 Chronic obstructive pulmonary disease with (acute) exacerbation: Secondary | ICD-10-CM

## 2018-09-02 DIAGNOSIS — J690 Pneumonitis due to inhalation of food and vomit: Secondary | ICD-10-CM

## 2018-09-02 LAB — GLUCOSE, CAPILLARY
Glucose-Capillary: 160 mg/dL — ABNORMAL HIGH (ref 70–99)
Glucose-Capillary: 136 mg/dL — ABNORMAL HIGH (ref 70–99)
Glucose-Capillary: 162 mg/dL — ABNORMAL HIGH (ref 70–99)
Glucose-Capillary: 158 mg/dL — ABNORMAL HIGH (ref 70–99)
Glucose-Capillary: 138 mg/dL — ABNORMAL HIGH (ref 70–99)

## 2018-09-02 LAB — BASIC METABOLIC PANEL
Anion gap: 7 (ref 5–15)
BUN: 22 mg/dL — ABNORMAL HIGH (ref 6–20)
CO2: 22 mmol/L (ref 22–32)
Calcium: 8 mg/dL — ABNORMAL LOW (ref 8.9–10.3)
Chloride: 115 mmol/L — ABNORMAL HIGH (ref 98–111)
Creatinine, Ser: 0.74 mg/dL (ref 0.44–1.00)
GFR calc Af Amer: >60 mL/min (ref >60)
GFR calc non Af Amer: >60 mL/min (ref >60)
Glucose, Bld: 201 mg/dL — ABNORMAL HIGH (ref 70–99)
Potassium: 3.8 mmol/L (ref 3.5–5.1)
Sodium: 144 mmol/L (ref 135–145)

## 2018-09-02 LAB — CBC
HCT: 36.1 % (ref 36.0–46.0)
Hemoglobin: 12.1 g/dL (ref 12.0–15.0)
MCH: 31.8 pg (ref 26.0–34.0)
MCHC: 33.5 g/dL (ref 30.0–36.0)
MCV: 94.8 fL (ref 80.0–100.0)
PLATELETS: 138 10*3/uL — AB (ref 150–400)
RBC: 3.81 MIL/uL — ABNORMAL LOW (ref 3.87–5.11)
RDW: 12.5 % (ref 11.5–15.5)
WBC: 23.9 10*3/uL — ABNORMAL HIGH (ref 4.0–10.5)
nRBC: 0 % (ref 0.0–0.2)

## 2018-09-02 LAB — HIV ANTIBODY (ROUTINE TESTING W REFLEX): HIV Screen 4th Generation wRfx: NONREACTIVE

## 2018-09-02 LAB — PHOSPHORUS: Phosphorus: 3.7 mg/dL (ref 2.5–4.6)

## 2018-09-02 LAB — PROCALCITONIN: Procalcitonin: 4.69 ng/mL

## 2018-09-02 LAB — PROTIME-INR
INR: 1.1 (ref 0.8–1.2)
Prothrombin Time: 14.5 seconds (ref 11.4–15.2)

## 2018-09-02 LAB — MAGNESIUM: Magnesium: 1.5 mg/dL — ABNORMAL LOW (ref 1.7–2.4)

## 2018-09-02 MED ORDER — METHYLPREDNISOLONE SODIUM SUCC 40 MG IJ SOLR
40.0000 mg | Freq: Two times a day (BID) | INTRAMUSCULAR | Status: DC
  Administered 2018-09-02 – 2018-09-03 (×2): 40 mg via INTRAVENOUS
  Filled 2018-09-02 (×3): qty 1

## 2018-09-02 MED ORDER — VITAL HIGH PROTEIN PO LIQD
1000.0000 mL | ORAL | Status: DC
  Administered 2018-09-03: 1000 mL

## 2018-09-02 MED ORDER — ADULT MULTIVITAMIN LIQUID CH
15.0000 mL | Freq: Every day | ORAL | Status: DC
  Administered 2018-09-03: 15 mL
  Filled 2018-09-02 (×2): qty 15

## 2018-09-02 MED ORDER — FREE WATER
100.0000 mL | Freq: Three times a day (TID) | Status: DC
  Administered 2018-09-03: 100 mL

## 2018-09-02 MED ORDER — VITAL HIGH PROTEIN PO LIQD: 1000.0000 mL | ORAL | Status: DC

## 2018-09-02 MED ORDER — HYDRALAZINE HCL 20 MG/ML IJ SOLN: 10.0000 mg | INTRAMUSCULAR | Status: DC | PRN

## 2018-09-02 MED ORDER — PRO-STAT SUGAR FREE PO LIQD: 30.0000 mL | Freq: Two times a day (BID) | ORAL | Status: DC

## 2018-09-02 MED ORDER — MIDAZOLAM HCL 2 MG/2ML IJ SOLN
2.0000 mg | INTRAMUSCULAR | Status: DC | PRN
  Administered 2018-09-02 – 2018-09-03 (×2): 2 mg via INTRAVENOUS
  Filled 2018-09-02: qty 2

## 2018-09-02 MED ORDER — MIDAZOLAM HCL 2 MG/2ML IJ SOLN
2.0000 mg | INTRAMUSCULAR | Status: DC | PRN
  Administered 2018-09-03: 2 mg via INTRAVENOUS
  Filled 2018-09-02 (×2): qty 2

## 2018-09-02 MED ORDER — INSULIN ASPART 100 UNIT/ML ~~LOC~~ SOLN
0.0000 [IU] | SUBCUTANEOUS | Status: DC
  Administered 2018-09-02: 3 [IU] via SUBCUTANEOUS
  Administered 2018-09-02 – 2018-09-03 (×3): 2 [IU] via SUBCUTANEOUS
  Administered 2018-09-03 (×2): 3 [IU] via SUBCUTANEOUS
  Filled 2018-09-02 (×6): qty 1

## 2018-09-02 NOTE — Progress Notes (Signed)
Progress Note  Patient Name: Suzanne Rocha Date of Encounter: 09/02/2018  Primary Cardiologist: New to [place]  Subjective   Intubated Sedation held Telemetry with sinus tachycardia EEG completed, followed by neurology MRI tomorrow  Inpatient Medications    Scheduled Meds: . budesonide (PULMICORT) nebulizer solution  0.5 mg Nebulization BID  . chlorhexidine gluconate (MEDLINE KIT)  15 mL Mouth Rinse BID  . fentaNYL (SUBLIMAZE) injection  50 mcg Intravenous Once  . free water  100 mL Per Tube Q8H  . heparin injection (subcutaneous)  5,000 Units Subcutaneous Q8H  . insulin aspart  0-15 Units Subcutaneous Q4H  . ipratropium-albuterol  3 mL Nebulization Q6H  . mouth rinse  15 mL Mouth Rinse 10 times per day  . methylPREDNISolone (SOLU-MEDROL) injection  40 mg Intravenous Q12H  . multivitamin  15 mL Per Tube Daily  . sodium chloride flush  10-40 mL Intracatheter Q12H   Continuous Infusions: . ceFEPime (MAXIPIME) IV Stopped (09/02/18 1037)  . famotidine (PEPCID) IV Stopped (09/02/18 0955)  . feeding supplement (VITAL HIGH PROTEIN)    . fentaNYL infusion INTRAVENOUS 25 mcg/hr (09/02/18 1106)  . metronidazole Stopped (09/02/18 1432)  . midazolam Stopped (09/02/18 1108)  . norepinephrine (LEVOPHED) Adult infusion Stopped (09/01/18 1816)   PRN Meds: acetaminophen, artificial tears, fentaNYL, ondansetron (ZOFRAN) IV, sodium chloride flush   Vital Signs    Vitals:   09/02/18 1500 09/02/18 1530 09/02/18 1600 09/02/18 1630  BP: (!) 151/95 (!) 144/97 (!) 144/92 (!) 146/110  Pulse: (!) 122 (!) 125 (!) 126 (!) 130  Resp: 18 20 (!) 21 19  Temp:    99 F (37.2 C)  TempSrc:    Esophageal  SpO2: 95% 94% 94% 94%  Weight:      Height:        Intake/Output Summary (Last 24 hours) at 09/02/2018 1803 Last data filed at 09/02/2018 1635 Gross per 24 hour  Intake 2046.41 ml  Output 980 ml  Net 1066.41 ml   Last 3 Weights 09/02/2018 09/01/2018 09/01/2018  Weight (lbs) 223 lb 15.8  oz 216 lb 14.9 oz 220 lb  Weight (kg) 101.6 kg 98.4 kg 99.791 kg      Telemetry    Sinus tachycardia- Personally Reviewed  ECG     - Personally Reviewed  Physical Exam   GEN: Intubated Neck:  Unable to estimate JVD Cardiac:  Regular, tachycardic,  no murmurs, rubs, or gallops.  Respiratory: Clear to auscultation bilaterally. GI: Soft, nontender, non-distended  MS: No edema; No deformity. Neuro:   Unable to test Psych: Unable to test  Labs    Chemistry Recent Labs  Lab 09/12/2018 2357 09/01/18 0411 09/01/18 1703 09/02/18 0318  NA 141 138 144 144  K 5.7* 5.2* 3.6 3.8  CL 108 109 117* 115*  CO2 18* 24 20* 22  GLUCOSE 307* 399* 147* 201*  BUN 11 16 21* 22*  CREATININE 1.11* 0.93 0.67 0.74  CALCIUM 8.7* 8.6* 8.4* 8.0*  PROT 6.1*  --   --   --   ALBUMIN 3.7  --   --   --   AST 135*  --   --   --   ALT 84*  --   --   --   ALKPHOS 79  --   --   --   BILITOT 0.6  --   --   --   GFRNONAA 54* >60 >60 >60  GFRAA >60 >60 >60 >60  ANIONGAP _0 7  Hematology Recent Labs  Lab 09/21/2018 2357 09/01/18 0411 09/02/18 0318  WBC 17.5* 39.5* 23.9*  RBC 4.62 4.28 3.81*  HGB 14.8 13.9 12.1  HCT 46.9* 41.9 36.1  MCV 101.5* 97.9 94.8  MCH 32.0 32.5 31.8  MCHC 31.6 33.2 33.5  RDW 12.1 12.3 12.5  PLT 234 243 138*    Cardiac Enzymes Recent Labs  Lab 08/28/2018 2357 09/01/18 0411 09/01/18 1115 09/01/18 1703  TROPONINI 0.09* 0.41* 0.45* 0.41*   No results for input(s): TROPIPOC in the last 168 hours.   BNPNo results for input(s): BNP, PROBNP in the last 168 hours.   DDimer No results for input(s): DDIMER in the last 168 hours.   Radiology    Dg Abdomen 1 View  Result Date: 09/01/2018 CLINICAL DATA:  OG tube placement. EXAM: ABDOMEN - 1 VIEW COMPARISON:  None. FINDINGS: Enteric tube tip is in the left upper quadrant consistent with location in the upper stomach. Gas-filled nondistended transverse colon. IMPRESSION: Enteric tube tip is in the left upper quadrant  consistent with location in the upper stomach. Electronically Signed   By: Lucienne Capers M.D.   On: 09/01/2018 00:50   Ct Head Wo Contrast  Result Date: 09/02/2018 CLINICAL DATA:  Anoxic brain injury. EXAM: CT HEAD WITHOUT CONTRAST TECHNIQUE: Contiguous axial images were obtained from the base of the skull through the vertex without intravenous contrast. COMPARISON:  September 01, 2018 FINDINGS: Brain: No subdural, epidural, or subarachnoid hemorrhage. Ventricles are stable. The sulci near the vertex are slightly less prominent. Cerebellum, brainstem, and basal cisterns are stable. No midline shift. No acute cortical ischemia or infarct identified. Vascular: No hyperdense vessel or unexpected calcification. Skull: Normal. Negative for fracture or focal lesion. Sinuses/Orbits: No acute finding. Other: None. IMPRESSION: 1. The sulci are slightly less prominent near the vertex suggesting the possibility of mild edema, given history. However, the ventricles and basal cisterns are stable and patent. No other change or acute abnormality noted. Electronically Signed   By: Dorise Bullion III M.D   On: 09/02/2018 12:03   Ct Head Wo Contrast  Result Date: 09/01/2018 CLINICAL DATA:  Patient loss consciousness and fell to the floor. Unresponsive upon EMS arrival. EXAM: CT HEAD WITHOUT CONTRAST TECHNIQUE: Contiguous axial images were obtained from the base of the skull through the vertex without intravenous contrast. COMPARISON:  None. FINDINGS: Brain: No evidence of acute infarction, hemorrhage, hydrocephalus, extra-axial collection or mass lesion/mass effect. Vascular: No hyperdense vessel or unexpected calcification. Skull: Calvarium appears intact. Sinuses/Orbits: Mild mucosal thickening in the paranasal sinuses. No acute air-fluid levels. Mastoid air cells are clear. Other: None. IMPRESSION: No acute intracranial abnormalities. Electronically Signed   By: Lucienne Capers M.D.   On: 09/01/2018 01:28   Ct Angio Chest  Pe W And/or Wo Contrast  Result Date: 09/01/2018 CLINICAL DATA:  Patient lost consciousness and fell. EXAM: CT ANGIOGRAPHY CHEST WITH CONTRAST TECHNIQUE: Multidetector CT imaging of the chest was performed using the standard protocol during bolus administration of intravenous contrast. Multiplanar CT image reconstructions and MIPs were obtained to evaluate the vascular anatomy. CONTRAST:  48m OMNIPAQUE IOHEXOL 350 MG/ML SOLN COMPARISON:  None. FINDINGS: Cardiovascular: Motion artifact limits examination. There is good opacification of the central and segmental pulmonary arteries. No focal filling defects. No evidence of significant pulmonary embolus. Normal heart size. No pericardial effusions. Normal caliber thoracic aorta with scattered calcifications. Mediastinum/Nodes: Esophagus is decompressed with an enteric tube in place. Endotracheal tube in place with tip above the carina. No significant lymphadenopathy.  Lungs/Pleura: Consolidation and volume loss in the right upper lung with patchy airspace infiltrates elsewhere in the right lung. Appearance likely represents pneumonia. Airways are patent. No pleural effusions. No pneumothorax. Upper Abdomen: Enteric tube tip is in the body of the stomach along the greater curvature. Musculoskeletal: Degenerative changes in the spine. No destructive bone lesions. Review of the MIP images confirms the above findings. IMPRESSION: No evidence of significant pulmonary embolus. Consolidation and volume loss in the right upper lung with patchy airspace infiltrates throughout the right lung likely representing pneumonia. Electronically Signed   By: Lucienne Capers M.D.   On: 09/01/2018 01:31   Dg Chest Port 1 View  Result Date: 09/02/2018 CLINICAL DATA:  Respiratory failure. EXAM: PORTABLE CHEST 1 VIEW COMPARISON:  Chest radiograph September 01, 2018 FINDINGS: Endotracheal tube tip projects 17 mm above the carina. RIGHT internal jugular central venous catheter distal tip  projects in distal superior vena cava. Nasogastric tube past proximal stomach, distal tip out of field of view. Cardiac silhouette is normal in size. Mediastinal silhouette is unremarkable. Retrocardiac consolidation. Increased interstitial prominence. Blunting of the LEFT costophrenic angle. No pneumothorax. Soft tissue planes and included osseous structures are unchanged. IMPRESSION: 1. No apparent change of life support lines. 2. New retrocardiac consolidation and increasing interstitial densities with small suspected LEFT pleural effusion. Electronically Signed   By: Elon Alas M.D.   On: 09/02/2018 04:58   Dg Chest Port 1 View  Result Date: 09/01/2018 CLINICAL DATA:  Encounter for central line placement EXAM: PORTABLE CHEST 1 VIEW COMPARISON:  Earlier today FINDINGS: Endotracheal tube tip between the clavicular heads and carina. Right IJ line with tip at the SVC. No pneumothorax or new mediastinal widening. The orogastric tube at least reaches the stomach. Hazy opacity at the right upper lobe which is likely improved since earlier. IMPRESSION: 1. New central line without complicating feature. 2. Improving right upper lobe aeration. Electronically Signed   By: Monte Fantasia M.D.   On: 09/01/2018 05:56   Dg Chest Portable 1 View  Result Date: 09/01/2018 CLINICAL DATA:  Respiratory distress. Post intubation, line placement, OG tube placement EXAM: PORTABLE CHEST 1 VIEW COMPARISON:  None. FINDINGS: The endotracheal tube is low, measuring only about 8 mm above the carina. Enteric tube tip is shown in the left upper quadrant consistent with location in the upper stomach. No radiopaque central venous catheters are identified. Heart size and pulmonary vascularity are normal. Lungs are clear and expanded. No blunting of costophrenic angles. No pneumothorax. Mediastinal contours appear intact. IMPRESSION: 1. Endotracheal tube is low, measuring about 8 mm above the carina. Enteric tube tip in the left upper  quadrant consistent with location in the upper stomach. Lungs are clear. 2. No pneumothorax seen. Electronically Signed   By: Lucienne Capers M.D.   On: 09/01/2018 00:49    Cardiac Studies   Echocardiogram  1. The left ventricle has low normal systolic function, with an ejection fraction of 50-55%. The cavity size was normal. Left ventricular diastolic function could not be evaluated.  2. The right ventricle has normal systolic function. The cavity was normal. There is no increase in right ventricular wall thickness. Right ventricular systolic pressure is mildly elevated with an estimated pressure of 34.4 mmHg.  3. Very limited images. non diagnostic for wall motion or valvular abnormalites.  4. The aortic valve was not assessed.  5. The mitral valve is grossly normal.  Patient Profile     63 year old female with history of nonobstructive coronary  artery disease on previous cardiac catheterization in July 3291, chronic systolic heart failure due to nonischemic cardiomyopathy with subsequent normalization of LV systolic function, family history of sudden cardiac death in her brother and multiple other comorbidities listed above. Presenting with out of hospital cardiac arrest  Assessment & Plan    Cardiac arrest Prolonged resuscitation time EEG results concerning Followed by neurology Off sedation.  Hypothermia protocol completed Prognosis poor Currently no plan for ischemic work-up We will continue to monitor through chart review   For questions or updates, please contact [facility] Please consult [email or web address] for contact info under        Signed, Ida Rogue, MD  09/02/2018, 6:03 PM

## 2018-09-02 NOTE — TOC Initial Note (Signed)
Transition of Care [facility]) - Initial/Assessment Note    Patient Details  Name: Suzanne Rocha MRN: [id number] Date of Birth: [birth year]  Transition of Care [facility]) CM/SW Contact:    Allayne Butcher, RN Phone Number: 09/02/2018, 1:47 PM  Clinical Narrative:                 Patient admitted to the ICU intubated and sedated.  Patient is from home with husband, family called EMS for respiratory distress but when EMS arrived patient was in cardiac arrest.  Patient experienced a prolonged down time and has a poor prognosis.  Patient is currently not on any sedation and is unresponsive.  Neurology is following.  No family currently at the bedside.  RNCM will cont to follow.   Expected Discharge Plan: Long Term Nursing Home     Patient Goals and CMS Choice        Expected Discharge Plan and Services Expected Discharge Plan: Long Term Nursing Home                                Prior Living Arrangements/Services   Lives with:: Spouse                   Activities of Daily Living Home Assistive Devices/Equipment: CBG Meter ADL Screening (condition at time of admission) Patient's cognitive ability adequate to safely complete daily activities?: Yes Is the patient deaf or have difficulty hearing?: No Does the patient have difficulty seeing, even when wearing glasses/contacts?: No Does the patient have difficulty concentrating, remembering, or making decisions?: No Patient able to express need for assistance with ADLs?: Yes Does the patient have difficulty dressing or bathing?: No Independently performs ADLs?: Yes (appropriate for developmental age) Does the patient have difficulty walking or climbing stairs?: No Weakness of Legs: None Weakness of Arms/Hands: None  Permission Sought/Granted                  Emotional Assessment Appearance:: Appears stated age, Well-Groomed Attitude/Demeanor/Rapport: Unresponsive       Psych Involvement: No (comment)  Admission  diagnosis:  Cardiac arrest (HCC) [I46.9] Respiratory arrest (HCC) [R09.2] Community acquired pneumonia, unspecified laterality [J18.9] Sepsis with acute hypercapnic respiratory failure and septic shock, due to unspecified organism (HCC) [A41.9, R65.21, J96.02] Patient Active Problem List   Diagnosis Date Noted  . Cardiac arrest (HCC) 09/01/2018  . Acute respiratory failure with hypoxia and hypercapnia (HCC) 09/01/2018  . Severe sepsis (HCC) 09/01/2018  . Aspiration pneumonia (HCC) 09/01/2018  . Diabetes (HCC) 09/01/2018  . HTN (hypertension) 09/01/2018  . COPD with acute exacerbation (HCC) 09/01/2018   PCP:  System, Pcp Not In Pharmacy:  No Pharmacies Listed    Social Determinants of Health (SDOH) Interventions    Readmission Risk Interventions 30 Day Unplanned Readmission Risk Score     ED to Hosp-Admission (Current) from 09[facility]22-[facility] in [facility] ICU/CCU  30 Day Unplanned Readmission Risk Score (%)  16 Filed at 09/02/2018 1200     This score is the patient's risk of an unplanned readmission within 30 days of being discharged (0 -100%). The score is based on dignosis, age, lab data, medications, orders, and past utilization.   Low:  0-14.9   Medium: 15-21.9   High: 22-29.9   Extreme: 30 and above       No flowsheet data found.

## 2018-09-02 NOTE — Procedures (Signed)
ELECTROENCEPHALOGRAM REPORT   Patient: Suzanne Rocha       Room #: IC15C EEG No. ID: [id number] Age: 63 y.o.        Sex: female Referring Physician: Enid Baas Report Date:  09/02/2018        Interpreting Physician: Thana Farr  History: [name] is an 63 y.o. female s/p arrest  Medications:  Maxipime, Fentanyl. Insulin, Solu-cortef, Flagyl, MVI,   Conditions of Recording:  This is a 21 channel routine scalp EEG performed with bipolar and monopolar montages arranged in accordance to the international 10/20 system of electrode placement. One channel was dedicated to EKG recording.  The patient is in the intubated and unresponsive state.  Description:  The background activity is very low voltage and unable to be appreciated at the standard sensitivity of 7uV/mm.  When the sensitivity is increased to 3uV/mm a background rhythm can be appreciated.  It consists of a low voltage, delta activity with a maximum frequency of 5 Hz noted.  This activity is continuous and diffusely distributed.  There is no improvement in the background rhythm with stimulation.  No epileptiform activity is noted.   Hyperventilation and intermittent photic stimulation were not performed.   IMPRESSION: This is an abnormal EEG secondary to very low voltage general background slowing.  This finding may be seen with a diffuse disturbance that is etiologically nonspecific, but may include a metabolic encephalopathy, among other possibilities.  No epileptiform activity was noted.     Thana Farr, MD Neurology [phone] 09/02/2018, 2:30 PM

## 2018-09-02 NOTE — Progress Notes (Signed)
eLink Physician-Brief Progress Note Patient Name: Suzanne Rocha DOB: [birth year] MRN: [id number]   Date of Service  09/02/2018  HPI/Events of Note  Agitation - Increased RR and BP.  eICU Interventions  Will order: 1. Start Fentanyl IV infusion already ordered.     Intervention Category Major Interventions: Delirium, psychosis, severe agitation - evaluation and management  Sommer,Steven Eugene 09/02/2018, 9:02 PM

## 2018-09-02 NOTE — Progress Notes (Signed)
[facility] [place] [facility]   PATIENT NAME: Suzanne Rocha    MR#:  [id number]  DATE OF BIRTH:  June 01, 1956  SUBJECTIVE:  CHIEF COMPLAINT:   Chief Complaint  Patient presents with  . Respiratory Arrest   Patient admitted to the ICU following cardiac arrest.  Currently on the vent and not requiring any sedation.    REVIEW OF SYSTEMS:  ROS Unobtainable due to patient being on the vent. DRUG ALLERGIES:   Allergies  Allergen Reactions  . Clindamycin Hives and Swelling  . Macrolides And Ketolides Anaphylaxis  . Erythromycin   . Penicillins Hives and Swelling   VITALS:  Blood pressure 139/89, pulse (!) 122, temperature 99 F (37.2 C), temperature source Esophageal, resp. rate 18, height 5\' 2"  (1.575 m), weight 101.6 kg, SpO2 96 %. PHYSICAL EXAMINATION:  Physical Exam  Constitutional: She appears well-developed.  Patient on the vent  HENT:  Head: Normocephalic.  Mouth/Throat: Oropharynx is clear and moist.  Eyes: Pupils are equal, round, and reactive to light. Conjunctivae are normal. Right eye exhibits no discharge.  Neck: Neck supple. No tracheal deviation present.  Cardiovascular: Normal rate, regular rhythm and normal heart sounds.  Respiratory: No stridor. No respiratory distress.  Patient on the vent  GI: Soft. Bowel sounds are normal. She exhibits no distension.  Musculoskeletal:        General: No deformity or edema.  Neurological:   Patient on vent and not requiring any sedation.  Neuro examination deferred  Skin: Skin is warm. No erythema.  Psychiatric:  patienton the vent.  Psych evaluation deferred   LABORATORY PANEL:  Female CBC Recent Labs  Lab 09/02/18 0318  WBC 23.9*  HGB 12.1  HCT 36.1  PLT 138*   ------------------------------------------------------------------------------------------------------------------ Chemistries  Recent Labs  Lab 09-29-2018 2357  09/02/18 0318  NA 141   < > 144  K 5.7*   < > 3.8  CL 108   <  > 115*  CO2 18*   < > 22  GLUCOSE 307*   < > 201*  BUN 11   < > 22*  CREATININE 1.11*   < > 0.74  CALCIUM 8.7*   < > 8.0*  MG  --    < > 1.5*  AST 135*  --   --   ALT 84*  --   --   ALKPHOS 79  --   --   BILITOT 0.6  --   --    < > = values in this interval not displayed.   RADIOLOGY:  Ct Head Wo Contrast  Result Date: 09/02/2018 CLINICAL DATA:  Anoxic brain injury. EXAM: CT HEAD WITHOUT CONTRAST TECHNIQUE: Contiguous axial images were obtained from the base of the skull through the vertex without intravenous contrast. COMPARISON:  September 01, 2018 FINDINGS: Brain: No subdural, epidural, or subarachnoid hemorrhage. Ventricles are stable. The sulci near the vertex are slightly less prominent. Cerebellum, brainstem, and basal cisterns are stable. No midline shift. No acute cortical ischemia or infarct identified. Vascular: No hyperdense vessel or unexpected calcification. Skull: Normal. Negative for fracture or focal lesion. Sinuses/Orbits: No acute finding. Other: None. IMPRESSION: 1. The sulci are slightly less prominent near the vertex suggesting the possibility of mild edema, given history. However, the ventricles and basal cisterns are stable and patent. No other change or acute abnormality noted. Electronically Signed   By: Gerome Sam III M.D   On: 09/02/2018 12:03   Dg Chest Port 1 View  Result  Date: 09/02/2018 CLINICAL DATA:  Respiratory failure. EXAM: PORTABLE CHEST 1 VIEW COMPARISON:  Chest radiograph September 01, 2018 FINDINGS: Endotracheal tube tip projects 17 mm above the carina. RIGHT internal jugular central venous catheter distal tip projects in distal superior vena cava. Nasogastric tube past proximal stomach, distal tip out of field of view. Cardiac silhouette is normal in size. Mediastinal silhouette is unremarkable. Retrocardiac consolidation. Increased interstitial prominence. Blunting of the LEFT costophrenic angle. No pneumothorax. Soft tissue planes and included osseous  structures are unchanged. IMPRESSION: 1. No apparent change of life support lines. 2. New retrocardiac consolidation and increasing interstitial densities with small suspected LEFT pleural effusion. Electronically Signed   By: Awilda Metro M.D.   On: 09/02/2018 04:58   ASSESSMENT AND PLAN:     1. Cardiac arrest [facility]) -unclear etiology upfront.  Patient had out of hospital prolonged cardiopulmonary arrest.  Exact etiology not clear.  Given sudden onset arrhythmia cannot be excluded. Seen by cardiologist.  2D echocardiogram done revealed ejection fraction of 50 to 55%.  Cavity size was normal.   Minimally elevated troponin likely related to prolonged CPR.  No evidence of acute coronary syndrome.  Heparin drip was discontinued. Being managed by critical care team and cardiology service.  2.  Myoclonus following cardiac arrest. Seen by neurologist.  Full neuro exam cannot be done due to patient being sedated on the vent at this time.  Patient had a repeat CT head done today which revealed the sulci are slightly less prominent near the vertex suggesting the possibility of mild edema, given history. However, the ventricles and basal cisterns are stable and patent. No other change or acute abnormality noted EEG done reviewed an abnormal EEG secondary to very low voltage general background slowing.  This finding may be seen with a diffuse disturbance that is etiologically nonspecific, but may include a metabolic encephalopathy, among other possibilities.  No epileptiform activity was noted Further recommendations per neurology.  3.  Acute respiratory failure with hypoxia and hypercapnia Being managed by critical care team.   4.  Septic shock secondary to right upper lobe pneumonia Patient on the vent.  Was requiring Levophed drip Continue broad-spectrum IV antibiotics with IV vancomycin and cefepime.  Patient noted to be on Flagyl as well.  5.  COPD exacerbation.  On IV Solu-Medrol.  Nebulizer  treatments.  Empiric antibiotics.  Vent management by pulmonary and critical care physician  6.  Diabetes mellitus type 2 Monitor blood sugars and adjust regimen as needed.  DVT prophylaxis; heparin  All the records are reviewed and case discussed with Care Management/Social Worker. Management plans discussed with the patient, family and they are in agreement.  CODE STATUS: Full Code  TOTAL TIME TAKING CARE OF THIS PATIENT: 35 minutes.   More than 50% of the time was spent in counseling/coordination of care: YES  POSSIBLE D/C IN 3 DAYS, DEPENDING ON CLINICAL CONDITION.   Clarine Elrod M.D on 09/02/2018 at 3:26 PM  Between 7am to 6pm - Pager - [phone]  After 6pm go to [email or web address] - Social research officer, government  [facility] [place] [facility]  Office  [phone]  CC: Primary care physician; System, Pcp Not In  Note: This dictation was prepared with Dragon dictation along with smaller phrase technology. Any transcriptional errors that result from this process are unintentional.

## 2018-09-02 NOTE — Progress Notes (Signed)
Subjective: Patient remains intubated and poorly responsive.  Off Versed but still requires some Fentanyl.  No myoclonic activity noted.    Objective: Current vital signs: BP 107/76   Pulse (!) 120   Temp 98.2 F (36.8 C) (Esophageal)   Resp (!) 22   Ht '5\' 2"'$  (1.575 m)   Wt 101.6 kg   SpO2 93%   BMI 40.97 kg/m  Vital signs in last 24 hours: Temp:  [95.5 F (35.3 C)-98.8 F (37.1 C)] 98.2 F (36.8 C) (03/10 0750) Pulse Rate:  [54-120] 120 (03/10 0900) Resp:  [20-26] 22 (03/10 0900) BP: (70-107)/(58-81) 107/76 (03/10 0900) SpO2:  [79 %-97 %] 93 % (03/10 0900) Arterial Line BP: (101-127)/(61-73) 110/66 (03/10 0800) FiO2 (%):  [35 %-45 %] 45 % (03/10 0800) Weight:  [101.6 kg] 101.6 kg (03/10 0419)  Intake/Output from previous day: 03/09 0701 - 03/10 0700 In: 2555.8 [I.V.:1730.2; IV Piggyback:825.5] Out: 3151 [Urine:960; Emesis/NG output:400; Stool:60] Intake/Output this shift: Total I/O In: 200.9 [I.V.:193.4; IV Piggyback:7.5] Out: 35 [Urine:35] Nutritional status:  Diet Order    None      Neurologic Exam: Mental Status: Patient does not respond to verbal stimuli.  Eyes open with deep sternal rub.  Also appears to start to posture in the LUE.  Does not follow commands.  No verbalizations noted.  Cranial Nerves: II: patient does not respond confrontation bilaterally, pupils right 3 mm, left 3 mm,and unreactive bilaterally III,IV,VI: doll's response present bilaterally. V,VII: corneal reflex absent on the right  VIII: patient does not respond to verbal stimuli IX,X: gag reflex reduced, XI: trapezius strength unable to test bilaterally XII: tongue strength unable to test Motor: Extremities flaccid throughout.  No spontaneous movement noted.  No purposeful movements noted. Sensory: Does not respond to noxious stimuli in any extremity. Deep Tendon Reflexes:  Absent throughout. Plantars: Mute bilaterally Cerebellar: Unable to perform   Lab Results: Basic  Metabolic Panel: Recent Labs  Lab 08/30/2018 2357 09/01/18 0411 09/01/18 1703 09/02/18 0318  NA 141 138 144 144  K 5.7* 5.2* 3.6 3.8  CL 108 109 117* 115*  CO2 18* 24 20* 22  GLUCOSE 307* 399* 147* 201*  BUN 11 16 21* 22*  CREATININE 1.11* 0.93 0.67 0.74  CALCIUM 8.7* 8.6* 8.4* 8.0*  MG  --  2.2 1.6* 1.5*  PHOS  --  5.4* 3.2 3.7    Liver Function Tests: Recent Labs  Lab 09/04/2018 2357  AST 135*  ALT 84*  ALKPHOS 79  BILITOT 0.6  PROT 6.1*  ALBUMIN 3.7   No results for input(s): LIPASE, AMYLASE in the last 168 hours. No results for input(s): AMMONIA in the last 168 hours.  CBC: Recent Labs  Lab 09/20/2018 2357 09/01/18 0411 09/02/18 0318  WBC 17.5* 39.5* 23.9*  NEUTROABS 6.3  --   --   HGB 14.8 13.9 12.1  HCT 46.9* 41.9 36.1  MCV 101.5* 97.9 94.8  PLT 234 243 138*    Cardiac Enzymes: Recent Labs  Lab 08/29/2018 2357 09/01/18 0411 09/01/18 1115 09/01/18 1703  TROPONINI 0.09* 0.41* 0.45* 0.41*    Lipid Panel: No results for input(s): CHOL, TRIG, HDL, CHOLHDL, VLDL, LDLCALC in the last 168 hours.  CBG: Recent Labs  Lab 09/01/18 2128 09/01/18 2236 09/01/18 2319 09/02/18 0320 09/02/18 0728  GLUCAP 159* 158* 150* 160* 136*    Microbiology: Results for orders placed or performed during the hospital encounter of 08/25/2018  Blood culture (routine x 2)     Status: None (Preliminary result)  Collection Time: 09/01/18  1:54 AM  Result Value Ref Range Status   Specimen Description BLOOD R HAND  Final   Special Requests   Final    BOTTLES DRAWN AEROBIC AND ANAEROBIC Blood Culture adequate volume   Culture   Final    NO GROWTH 1 DAY Performed at [facility], [address] [place], [place] [address]    Report Status PENDING  Incomplete  Blood culture (routine x 2)     Status: None (Preliminary result)   Collection Time: 09/01/18  2:03 AM  Result Value Ref Range Status   Specimen Description BLOOD L HAND  Final   Special Requests   Final     BOTTLES DRAWN AEROBIC AND ANAEROBIC Blood Culture adequate volume   Culture   Final    NO GROWTH 1 DAY Performed at [facility], [address], [place], [place] [address]    Report Status PENDING  Incomplete  Gastrointestinal Panel by PCR , Stool     Status: None   Collection Time: 09/01/18  4:05 AM  Result Value Ref Range Status   Campylobacter species NOT DETECTED NOT DETECTED Final   Plesimonas shigelloides NOT DETECTED NOT DETECTED Final   Salmonella species NOT DETECTED NOT DETECTED Final   Yersinia enterocolitica NOT DETECTED NOT DETECTED Final   Vibrio species NOT DETECTED NOT DETECTED Final   Vibrio cholerae NOT DETECTED NOT DETECTED Final   Enteroaggregative E coli (EAEC) NOT DETECTED NOT DETECTED Final   Enteropathogenic E coli (EPEC) NOT DETECTED NOT DETECTED Final   Enterotoxigenic E coli (ETEC) NOT DETECTED NOT DETECTED Final   Shiga like toxin producing E coli (STEC) NOT DETECTED NOT DETECTED Final   Shigella/Enteroinvasive E coli (EIEC) NOT DETECTED NOT DETECTED Final   Cryptosporidium NOT DETECTED NOT DETECTED Final   Cyclospora cayetanensis NOT DETECTED NOT DETECTED Final   Entamoeba histolytica NOT DETECTED NOT DETECTED Final   Giardia lamblia NOT DETECTED NOT DETECTED Final   Adenovirus F40/41 NOT DETECTED NOT DETECTED Final   Astrovirus NOT DETECTED NOT DETECTED Final   Norovirus GI/GII NOT DETECTED NOT DETECTED Final   Rotavirus A NOT DETECTED NOT DETECTED Final   Sapovirus (I, II, IV, and V) NOT DETECTED NOT DETECTED Final    Comment: Performed at [facility], [address] [place], [place] [address]  C Difficile Quick Screen w PCR reflex     Status: None   Collection Time: 09/01/18  4:05 AM  Result Value Ref Range Status   C Diff antigen NEGATIVE NEGATIVE Final   C Diff toxin NEGATIVE NEGATIVE Final   C Diff interpretation No C. difficile detected.  Final    Comment: Performed at [facility], [address]  [place], [place] [address]  MRSA PCR Screening     Status: None   Collection Time: 09/01/18  5:31 AM  Result Value Ref Range Status   MRSA by PCR NEGATIVE NEGATIVE Final    Comment:        The GeneXpert MRSA Assay (FDA approved for NASAL specimens only), is one component of a comprehensive MRSA colonization surveillance program. It is not intended to diagnose MRSA infection nor to guide or monitor treatment for MRSA infections. Performed at [facility], [address] [place], [place] [address]   Culture, respiratory (non-expectorated)     Status: None (Preliminary result)   Collection Time: 09/01/18 11:37 AM  Result Value Ref Range Status   Specimen Description   Final  TRACHEAL ASPIRATE Performed at [facility], [address], [place], [place] [address]    Special Requests   Final    NONE Performed at [facility], [place], [place] [address]    Gram Stain   Final    FEW WBC PRESENT, PREDOMINANTLY PMN MODERATE GRAM POSITIVE COCCI RARE GRAM POSITIVE RODS Performed at [facility], [place], [place], [place] [address]    Culture PENDING  Incomplete   Report Status PENDING  Incomplete    Coagulation Studies: Recent Labs    09/01/18 0411 09/02/18 0318  LABPROT 15.3* 14.5  INR 1.2 1.1    Imaging: Dg Abdomen 1 View  Result Date: 09/01/2018 CLINICAL DATA:  OG tube placement. EXAM: ABDOMEN - 1 VIEW COMPARISON:  None. FINDINGS: Enteric tube tip is in the left upper quadrant consistent with location in the upper stomach. Gas-filled nondistended transverse colon. IMPRESSION: Enteric tube tip is in the left upper quadrant consistent with location in the upper stomach. Electronically Signed   By: Lucienne Capers M.D.   On: 09/01/2018 00:50   Ct Head Wo Contrast  Result Date: 09/01/2018 CLINICAL DATA:  Patient loss consciousness and fell to the floor. Unresponsive upon EMS arrival. EXAM: CT HEAD WITHOUT CONTRAST  TECHNIQUE: Contiguous axial images were obtained from the base of the skull through the vertex without intravenous contrast. COMPARISON:  None. FINDINGS: Brain: No evidence of acute infarction, hemorrhage, hydrocephalus, extra-axial collection or mass lesion/mass effect. Vascular: No hyperdense vessel or unexpected calcification. Skull: Calvarium appears intact. Sinuses/Orbits: Mild mucosal thickening in the paranasal sinuses. No acute air-fluid levels. Mastoid air cells are clear. Other: None. IMPRESSION: No acute intracranial abnormalities. Electronically Signed   By: Lucienne Capers M.D.   On: 09/01/2018 01:28   Ct Angio Chest Pe W And/or Wo Contrast  Result Date: 09/01/2018 CLINICAL DATA:  Patient lost consciousness and fell. EXAM: CT ANGIOGRAPHY CHEST WITH CONTRAST TECHNIQUE: Multidetector CT imaging of the chest was performed using the standard protocol during bolus administration of intravenous contrast. Multiplanar CT image reconstructions and MIPs were obtained to evaluate the vascular anatomy. CONTRAST:  51m OMNIPAQUE IOHEXOL 350 MG/ML SOLN COMPARISON:  None. FINDINGS: Cardiovascular: Motion artifact limits examination. There is good opacification of the central and segmental pulmonary arteries. No focal filling defects. No evidence of significant pulmonary embolus. Normal heart size. No pericardial effusions. Normal caliber thoracic aorta with scattered calcifications. Mediastinum/Nodes: Esophagus is decompressed with an enteric tube in place. Endotracheal tube in place with tip above the carina. No significant lymphadenopathy. Lungs/Pleura: Consolidation and volume loss in the right upper lung with patchy airspace infiltrates elsewhere in the right lung. Appearance likely represents pneumonia. Airways are patent. No pleural effusions. No pneumothorax. Upper Abdomen: Enteric tube tip is in the body of the stomach along the greater curvature. Musculoskeletal: Degenerative changes in the spine. No  destructive bone lesions. Review of the MIP images confirms the above findings. IMPRESSION: No evidence of significant pulmonary embolus. Consolidation and volume loss in the right upper lung with patchy airspace infiltrates throughout the right lung likely representing pneumonia. Electronically Signed   By: WLucienne CapersM.D.   On: 09/01/2018 01:31   Dg Chest Port 1 View  Result Date: 09/02/2018 CLINICAL DATA:  Respiratory failure. EXAM: PORTABLE CHEST 1 VIEW COMPARISON:  Chest radiograph September 01, 2018 FINDINGS: Endotracheal tube tip projects 17 mm above the carina. RIGHT internal jugular central venous catheter distal tip projects in distal superior vena cava. Nasogastric tube  past proximal stomach, distal tip out of field of view. Cardiac silhouette is normal in size. Mediastinal silhouette is unremarkable. Retrocardiac consolidation. Increased interstitial prominence. Blunting of the LEFT costophrenic angle. No pneumothorax. Soft tissue planes and included osseous structures are unchanged. IMPRESSION: 1. No apparent change of life support lines. 2. New retrocardiac consolidation and increasing interstitial densities with small suspected LEFT pleural effusion. Electronically Signed   By: Elon Alas M.D.   On: 09/02/2018 04:58   Dg Chest Port 1 View  Result Date: 09/01/2018 CLINICAL DATA:  Encounter for central line placement EXAM: PORTABLE CHEST 1 VIEW COMPARISON:  Earlier today FINDINGS: Endotracheal tube tip between the clavicular heads and carina. Right IJ line with tip at the SVC. No pneumothorax or new mediastinal widening. The orogastric tube at least reaches the stomach. Hazy opacity at the right upper lobe which is likely improved since earlier. IMPRESSION: 1. New central line without complicating feature. 2. Improving right upper lobe aeration. Electronically Signed   By: Monte Fantasia M.D.   On: 09/01/2018 05:56   Dg Chest Portable 1 View  Result Date: 09/01/2018 CLINICAL DATA:   Respiratory distress. Post intubation, line placement, OG tube placement EXAM: PORTABLE CHEST 1 VIEW COMPARISON:  None. FINDINGS: The endotracheal tube is low, measuring only about 8 mm above the carina. Enteric tube tip is shown in the left upper quadrant consistent with location in the upper stomach. No radiopaque central venous catheters are identified. Heart size and pulmonary vascularity are normal. Lungs are clear and expanded. No blunting of costophrenic angles. No pneumothorax. Mediastinal contours appear intact. IMPRESSION: 1. Endotracheal tube is low, measuring about 8 mm above the carina. Enteric tube tip in the left upper quadrant consistent with location in the upper stomach. Lungs are clear. 2. No pneumothorax seen. Electronically Signed   By: Lucienne Capers M.D.   On: 09/01/2018 00:49    Medications:  I have reviewed the patient's current medications. Scheduled: . budesonide (PULMICORT) nebulizer solution  0.5 mg Nebulization BID  . chlorhexidine gluconate (MEDLINE KIT)  15 mL Mouth Rinse BID  . fentaNYL (SUBLIMAZE) injection  50 mcg Intravenous Once  . heparin injection (subcutaneous)  5,000 Units Subcutaneous Q8H  . insulin aspart  3-9 Units Subcutaneous Q4H  . insulin detemir  12 Units Subcutaneous Q12H  . ipratropium-albuterol  3 mL Nebulization Q6H  . mouth rinse  15 mL Mouth Rinse 10 times per day  . methylPREDNISolone (SOLU-MEDROL) injection  40 mg Intravenous Q12H  . sodium chloride flush  10-40 mL Intracatheter Q12H    Assessment/Plan: 63 year old female s/p arrest with prolonged downtime.  On Fentanyl.  Initially with development of myoclonic activity that has improved today.  Brain stem responses noted on neurological examination as seen on yesterday.  Initial head CT on 3/9 was unremarkable.    Recommendations: 1. EEG pending 2. Repeat head CT   LOS: 1 day   Alexis Goodell, MD Neurology [phone] 09/02/2018  9:57 AM

## 2018-09-02 NOTE — Progress Notes (Signed)
Pastoral Care Visit    09/02/18 1435  Clinical Encounter Type  Visited With Family  Visit Type Follow-up;Psychological support  Referral From Family  Consult/Referral To Chaplain  Recommendations f/u  Spiritual Encounters  Spiritual Needs Emotional  Stress Factors  Family Stress Factors Health changes   Chap visited w/ family in hallway outside of elevators after neuro report given.  Family is distressed about pt condition and struggling to cope with possible prognosis.  Chap listened and offered emotional support.  Milinda Antis, [address]

## 2018-09-02 NOTE — Progress Notes (Signed)
PCCM PROGRESS NOTE    PT PROFILE: 63 y.o. female smoker suffered prolonged out of hospital cardiac arrest after initially developing acute respiratory distress.  Intubated in ED and TTM protocol implemented.  MAJOR EVENTS/TEST RESULTS: 03/09 Admission via ED after prolonged cardiac arrest 03/09 CT head: No acute abnormalities 03/09 CTA chest: No evidence of significant pulmonary embolus. Consolidation and volume loss in the right upper lung with patchy airspace infiltrates throughout the right lung likely representing pneumonia 03/09 Echo: LVEF 50-55%.  No segmental wall motion abnormalities noted.  No significant valvular abnormalities noted.  RVSP estimated 34 mmHg 03/10 CT head: The sulci are slightly less prominent near the vertex suggesting the possibility of mild edema, given history. However, the ventricles and basal cisterns are stable and patent 03/10 EEG:    INDWELLING DEVICES:: ETT 03/09 >>  R IJ CVL 03/09 >>  R femoral A line >>   MICRO DATA: MRSA PCR 03/09 >> NEG  Resp 03/09 >>  Blood 03/09 >>   ANTIMICROBIALS:  Vanc 03/09 >> 03/09 Metronidazole 03/09 >> 03/09 Cefepime 03/09 >>   SUBJ: Has rewarmed.  Remains minimally responsive.  Occasionally biting on tube.  Breathes over set rate on ventilator  Vitals:   09/02/18 1130 09/02/18 1230 09/02/18 1300 09/02/18 1330  BP: 103/70   (!) 146/100  Pulse: (!) 122 (!) 131 (!) 115 (!) 126  Resp: 15 (!) 26 (!) 31 20  Temp:  99.5 F (37.5 C)    TempSrc:  Esophageal    SpO2: 93% (!) 89% 91% 93%  Weight:      Height:       Vent Mode: PRVC FiO2 (%):  [35 %-60 %] 60 % Set Rate:  [15 bmp-22 bmp] 15 bmp Vt Set:  [500 mL] 500 mL PEEP:  [5 cmH20] 5 cmH20 Plateau Pressure:  [20 cmH20-24 cmH20] 20 cmH20   Intake/Output Summary (Last 24 hours) at 09/02/2018 1356 Last data filed at 09/02/2018 1229 Gross per 24 hour  Intake 2207.5 ml  Output 1140 ml  Net 1067.5 ml     EXAM:  Gen: Intubated, off continuous sedative  infusions, synchronous with vent, comatose HEENT: NCAT, sclera white, ETT present Neck: Supple without LAN, thyromegaly, JVD Lungs: Clear anteriorly Cardiovascular: RRR, no murmurs noted Abdomen: Obese, soft, nontender, normal BS Ext: Warm, no edema Neuro: CNs grossly intact, no spontaneous movement, no withdrawal Skin: Limited exam, no lesions noted  DATA:   BMP Latest Ref Rng & Units 09/02/2018 09/01/2018 09/01/2018  Glucose 70 - 99 mg/dL 342(A) 768(T) 157(W)  BUN 6 - 20 mg/dL 62(M) 35(D) 16  Creatinine 0.44 - 1.00 mg/dL 9.74 1.63 8.45  Sodium 135 - 145 mmol/L 144 144 138  Potassium 3.5 - 5.1 mmol/L 3.8 3.6 5.2(H)  Chloride 98 - 111 mmol/L 115(H) 117(H) 109  CO2 22 - 32 mmol/L 22 20(L) 24  Calcium 8.9 - 10.3 mg/dL 8.0(L) 8.4(L) 8.6(L)    CBC Latest Ref Rng & Units 09/02/2018 09/01/2018 09/19/2018  WBC 4.0 - 10.5 K/uL 23.9(H) 39.5(H) 17.5(H)  Hemoglobin 12.0 - 15.0 g/dL 36.4 68.0 32.1  Hematocrit 36.0 - 46.0 % 36.1 41.9 46.9(H)  Platelets 150 - 400 K/uL 138(L) 243 234    CXR:  LLL atx, vs effusion.  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 Mild cerebral edema  PLAN:  Cont full vent support - settings reviewed and/or adjusted Cont vent bundle Daily SBT if/when meets criteria ICU hemodynamic monitoring, MAP goal >65 mmHg Monitor BMET intermittently Monitor I/Os Correct electrolytes as indicated Initiate TF protocol Continue SSI, moderate scale DVT px: SQ heparin Monitor CBC intermittently Transfuse per usual  guidelines Monitor temp, WBC count Micro and abx as above Neurology following EEG today  Family updated in detail at bedside   CCM time: 40 mins  The above time includes time spent in consultation with patient and/or family members and reviewing care plan on multidisciplinary rounds  Billy Fischer, MD PCCM service Mobile [phone] Pager [phone] 09/02/2018 1:46 PM

## 2018-09-03 ENCOUNTER — Inpatient Hospital Stay: Payer: Self-pay

## 2018-09-03 ENCOUNTER — Other Ambulatory Visit: Payer: Self-pay

## 2018-09-03 DIAGNOSIS — Z7189 Other specified counseling: Secondary | ICD-10-CM

## 2018-09-03 LAB — BLOOD GAS, ARTERIAL
ACID-BASE DEFICIT: 4.2 mmol/L — AB (ref 0.0–2.0)
Bicarbonate: 21 mmol/L (ref 20.0–28.0)
FIO2: 0.35
MECHVT: 500 mL
Mechanical Rate: 22
O2 Saturation: 92.9 %
PEEP: 5 cmH2O
Patient temperature: 36.2
pCO2 arterial: 38 mmHg (ref 32.0–48.0)
pH, Arterial: 7.36 (ref 7.350–7.450)
pO2, Arterial: 66 mmHg — ABNORMAL LOW (ref 83.0–108.0)

## 2018-09-03 LAB — CBC
HCT: 36 % (ref 36.0–46.0)
Hemoglobin: 11.8 g/dL — ABNORMAL LOW (ref 12.0–15.0)
MCH: 32.4 pg (ref 26.0–34.0)
MCHC: 32.8 g/dL (ref 30.0–36.0)
MCV: 98.9 fL (ref 80.0–100.0)
Platelets: 141 10*3/uL — ABNORMAL LOW (ref 150–400)
RBC: 3.64 MIL/uL — ABNORMAL LOW (ref 3.87–5.11)
RDW: 12.8 % (ref 11.5–15.5)
WBC: 29.8 10*3/uL — ABNORMAL HIGH (ref 4.0–10.5)
nRBC: 0 % (ref 0.0–0.2)

## 2018-09-03 LAB — COMPREHENSIVE METABOLIC PANEL
ALT: 54 U/L — ABNORMAL HIGH (ref 0–44)
AST: 80 U/L — ABNORMAL HIGH (ref 15–41)
Albumin: 3.3 g/dL — ABNORMAL LOW (ref 3.5–5.0)
Alkaline Phosphatase: 33 U/L — ABNORMAL LOW (ref 38–126)
Anion gap: 5 (ref 5–15)
BUN: 27 mg/dL — ABNORMAL HIGH (ref 6–20)
CO2: 27 mmol/L (ref 22–32)
Calcium: 8.1 mg/dL — ABNORMAL LOW (ref 8.9–10.3)
Chloride: 112 mmol/L — ABNORMAL HIGH (ref 98–111)
Creatinine, Ser: 0.59 mg/dL (ref 0.44–1.00)
GFR calc Af Amer: >60 mL/min (ref >60)
GFR calc non Af Amer: >60 mL/min (ref >60)
Glucose, Bld: 177 mg/dL — ABNORMAL HIGH (ref 70–99)
Potassium: 4.3 mmol/L (ref 3.5–5.1)
SODIUM: 144 mmol/L (ref 135–145)
TOTAL PROTEIN: 5.8 g/dL — AB (ref 6.5–8.1)
Total Bilirubin: 0.8 mg/dL (ref 0.3–1.2)

## 2018-09-03 LAB — MAGNESIUM: MAGNESIUM: 1.8 mg/dL (ref 1.7–2.4)

## 2018-09-03 LAB — GLUCOSE, CAPILLARY
Glucose-Capillary: 149 mg/dL — ABNORMAL HIGH (ref 70–99)
Glucose-Capillary: 149 mg/dL — ABNORMAL HIGH (ref 70–99)
Glucose-Capillary: 168 mg/dL — ABNORMAL HIGH (ref 70–99)
Glucose-Capillary: 176 mg/dL — ABNORMAL HIGH (ref 70–99)

## 2018-09-03 LAB — PROCALCITONIN: Procalcitonin: 2.34 ng/mL

## 2018-09-03 LAB — PHOSPHORUS: Phosphorus: 3.8 mg/dL (ref 2.5–4.6)

## 2018-09-03 MED ORDER — LABETALOL HCL 100 MG PO TABS
100.0000 mg | ORAL_TABLET | Freq: Two times a day (BID) | ORAL | Status: DC
  Administered 2018-09-03: 100 mg
  Filled 2018-09-03 (×2): qty 1

## 2018-09-03 MED ORDER — MORPHINE SULFATE (PF) 2 MG/ML IV SOLN
2.0000 mg | INTRAVENOUS | Status: DC | PRN
  Administered 2018-09-03 – 2018-09-04 (×3): 2 mg via INTRAVENOUS
  Administered 2018-09-04 – 2018-09-05 (×3): 4 mg via INTRAVENOUS
  Administered 2018-09-05: 2 mg via INTRAVENOUS
  Administered 2018-09-05: 4 mg via INTRAVENOUS

## 2018-09-03 MED ORDER — ALBUTEROL SULFATE (2.5 MG/3ML) 0.083% IN NEBU
2.5000 mg | INHALATION_SOLUTION | RESPIRATORY_TRACT | Status: DC | PRN
  Administered 2018-09-03 – 2018-09-04 (×2): 2.5 mg via RESPIRATORY_TRACT
  Filled 2018-09-03: qty 3

## 2018-09-03 MED ORDER — LABETALOL HCL 5 MG/ML IV SOLN
20.0000 mg | INTRAVENOUS | Status: DC | PRN
  Administered 2018-09-04 (×2): 20 mg via INTRAVENOUS
  Filled 2018-09-03 (×2): qty 4

## 2018-09-03 MED ORDER — SODIUM CHLORIDE 0.9 % IV BOLUS
250.0000 mL | Freq: Once | INTRAVENOUS | Status: AC
  Administered 2018-09-03: 250 mL via INTRAVENOUS

## 2018-09-03 MED ORDER — FAMOTIDINE IN NACL 20-0.9 MG/50ML-% IV SOLN: 20.0000 mg | INTRAVENOUS | Status: DC

## 2018-09-03 MED ORDER — MORPHINE 100MG IN NS 100ML (1MG/ML) PREMIX INFUSION
10.0000 mg/h | INTRAVENOUS | Status: DC
  Administered 2018-09-03 – 2018-09-04 (×4): 10 mg/h via INTRAVENOUS
  Filled 2018-09-03 (×4): qty 100

## 2018-09-03 MED ORDER — LABETALOL HCL 5 MG/ML IV SOLN
20.0000 mg | INTRAVENOUS | Status: DC | PRN
  Administered 2018-09-03: 20 mg via INTRAVENOUS
  Filled 2018-09-03: qty 4

## 2018-09-03 MED ORDER — HYDRALAZINE HCL 20 MG/ML IJ SOLN
10.0000 mg | INTRAMUSCULAR | Status: DC | PRN
  Administered 2018-09-04: 20 mg via INTRAVENOUS
  Filled 2018-09-03: qty 1

## 2018-09-03 MED ORDER — ALBUTEROL SULFATE (2.5 MG/3ML) 0.083% IN NEBU
INHALATION_SOLUTION | RESPIRATORY_TRACT | Status: AC
  Administered 2018-09-03: 12:00:00
  Filled 2018-09-03: qty 3

## 2018-09-03 NOTE — Progress Notes (Signed)
Pastoral Care Visit   09/03/18 1640  Clinical Encounter Type  Visited With Patient and family together  Visit Type Follow-up  Referral From Family;Nurse  Consult/Referral To Chaplain  Spiritual Encounters  Spiritual Needs Prayer  Stress Factors  Family Stress Factors Loss;Major life changes;Health changes   Family called chaplain to room to pray over pt, as pt to be terminally extubated on Friday.  Family at bedside and supportive.  Mirna Mires provided grief support and prayer.  Milinda Antis, [address]

## 2018-09-03 NOTE — Progress Notes (Signed)
Pharmacy Antibiotic Note  Suzanne Rocha is a 63 y.o. female admitted on 2018/09/10 with cardiopulmonary arrest with possible septic shock. Patient currently undergoing targeted temperature management. Patient with likely right upper lobe pneumonia. Pharmacy has been consulted for cefepime dosing.  Plan: Continue cefepime 2g IV Q12hr.   Continue metronidazole 500mg  IV Q8hr.   Height: 5\' 2"  (157.5 cm) Weight: 228 lb 6.3 oz (103.6 kg) IBW/kg (Calculated) : 50.1  Temp (24hrs), Avg:99.1 F (37.3 C), Min:98.6 F (37 C), Max:99.5 F (37.5 C)  Recent Labs  Lab 2018/09/10 2357 09/01/18 0203 09/01/18 0411 09/01/18 0422 09/01/18 1703 09/02/18 0318 09/03/18 0519  WBC 17.5*  --  39.5*  --   --  23.9* 29.8*  CREATININE 1.11*  --  0.93  --  0.67 0.74 0.59  LATICACIDVEN >11.0* 6.6*  --  2.3*  --   --   --     Estimated Creatinine Clearance: 84.4 mL/min (by C-G formula based on SCr of 0.59 mg/dL).    Allergies  Allergen Reactions  . Clindamycin Hives and Swelling  . Macrolides And Ketolides Anaphylaxis  . Erythromycin   . Penicillins Hives and Swelling    Antimicrobials this admission: Clindamycin 3/9 x 1 Vancomycin 3/9 x 1  Metronidazole 3/9 >>  Cefepime 3/9 >>   Dose adjustments this admission: N/A  Microbiology results: 3/9 BCx: no growth x 2 days  3/9 Tracheal Aspirate: re-incubated for better growth  3/9 Cdiff antigen/toxin: negative  3/9 GI PCR: negative  3/9 MRSA PCR: negative   Thank you for allowing pharmacy to be a part of this patient's care.  Simpson,Michael L 09/03/2018 4:10 PM

## 2018-09-03 NOTE — Progress Notes (Signed)
[employer] [facility] [place] at [facility]   PATIENT NAME: Suzanne Rocha    MR#:  [id number]  DATE OF BIRTH:  [birth year]  SUBJECTIVE:  CHIEF COMPLAINT:   Chief Complaint  Patient presents with  . Respiratory Arrest   Patient admitted to the ICU following cardiac arrest.  Currently on the vent.sedation was previously discontinued.  Last night patient reported to have become more agitated and had to be started on fentanyl drip.   Updated multiple family members present at bedside on treatment plans.  REVIEW OF SYSTEMS:  ROS Unobtainable due to patient being on the vent. DRUG ALLERGIES:   Allergies  Allergen Reactions  . Clindamycin Hives and Swelling  . Macrolides And Ketolides Anaphylaxis  . Erythromycin   . Penicillins Hives and Swelling   VITALS:  Blood pressure 140/88, pulse (!) 107, temperature 99.5 F (37.5 C), temperature source Esophageal, resp. rate 18, height 5\' 2"  (1.575 m), weight 103.6 kg, SpO2 93 %. PHYSICAL EXAMINATION:  Physical Exam  Constitutional: She appears well-developed.  Patient on the vent  HENT:  Head: Normocephalic.  Mouth/Throat: Oropharynx is clear and moist.  Eyes: Pupils are equal, round, and reactive to light. Conjunctivae are normal. Right eye exhibits no discharge.  Neck: Neck supple. No tracheal deviation present.  Cardiovascular: Normal rate, regular rhythm and normal heart sounds.  Respiratory: No stridor. No respiratory distress.  Patient on the vent  GI: Soft. Bowel sounds are normal. She exhibits no distension.  Musculoskeletal:        General: No deformity or edema.  Neurological:   Patient on vent and not requiring any sedation.  Neuro examination deferred  Skin: Skin is warm. No erythema.  Psychiatric:  patienton the vent.  Psych evaluation deferred   LABORATORY PANEL:  Female CBC Recent Labs  Lab 09/03/18 0519  WBC 29.8*  HGB 11.8*  HCT 36.0  PLT 141*    ------------------------------------------------------------------------------------------------------------------ Chemistries  Recent Labs  Lab 09/03/18 0519  NA 144  K 4.3  CL 112*  CO2 27  GLUCOSE 177*  BUN 27*  CREATININE 0.59  CALCIUM 8.1*  MG 1.8  AST 80*  ALT 54*  ALKPHOS 33*  BILITOT 0.8   RADIOLOGY:  Dg Chest Port 1 View  Result Date: 09/03/2018 CLINICAL DATA:  Respiratory failure EXAM: PORTABLE CHEST 1 VIEW COMPARISON:  Yesterday FINDINGS: Endotracheal tube tip is nearly 2 cm above the carina. The orogastric tube at least reaches the stomach. Right IJ line with tip at the distal SVC. Low volume chest with cardiomegaly and vascular pedicle widening. Unchanged haziness of the left lower chest, likely atelectasis based on chest CT from 2 days ago. Improved right lung aeration. IMPRESSION: 1. Stable hardware positioning. 2. Improved right-sided aeration. Electronically Signed   By: Marnee Spring M.D.   On: 09/03/2018 06:26   ASSESSMENT AND PLAN:     1. Cardiac arrest [facility]) -unclear etiology upfront.  Patient had out of hospital prolonged cardiopulmonary arrest.  Exact etiology not clear.  Given sudden onset arrhythmia cannot be excluded.  Hypothermia protocol completed. Seen by cardiologist.  2D echocardiogram done revealed ejection fraction of 50 to 55%.  Cavity size was normal.   Minimally elevated troponin likely related to prolonged CPR.  No evidence of acute coronary syndrome.  Heparin drip was discontinued. Being managed by critical care team and cardiology service.  2.  Myoclonus following cardiac arrest. Patient had a repeat CT head done on 09/02/2018 which revealed the sulci are slightly less prominent  near the vertex suggesting the possibility of mild edema, given history. However, the ventricles and basal cisterns are stable and patent. No other change or acute abnormality noted EEG done reviewed an abnormal EEG secondary to very low voltage general background  slowing.  This finding may be seen with a diffuse disturbance that is etiologically nonspecific, but may include a metabolic encephalopathy, among other possibilities.  No epileptiform activity was noted Further recommendations per neurology.  3.  Acute respiratory failure with hypoxia and hypercapnia Being managed by critical care team.   4.  Septic shock secondary to right upper lobe pneumonia Patient on the vent.  Off Levophed drip.  Was previously on IV vancomycin and cefepime.  Noted to be currently on IV cefepime and vancomycin.  Being managed by critical care team   5.  COPD exacerbation.  Off Solu-Medrol.  Nebulizer treatments.  Empiric antibiotics.   Vent management by pulmonary and critical care physician  6.  Diabetes mellitus type 2 Monitor blood sugars and adjust regimen as needed.  DVT prophylaxis; heparin  All the records are reviewed and case discussed with Care Management/Social Worker. Management plans discussed with the patient, family and they are in agreement.  CODE STATUS: Full Code  TOTAL TIME TAKING CARE OF THIS PATIENT: 36 minutes.   More than 50% of the time was spent in counseling/coordination of care: YES  POSSIBLE D/C IN 2-3 DAYS, DEPENDING ON CLINICAL CONDITION.   Kalev Temme M.D on 09/03/2018 at 2:20 PM  Between 7am to 6pm - Pager - [phone]  After 6pm go to [email or web address] - Social research officer, government  [employer] [facility]  Office  [phone]  CC: Primary care physician; System, Pcp Not In  Note: This dictation was prepared with Dragon dictation along with smaller phrase technology. Any transcriptional errors that result from this process are unintentional.

## 2018-09-03 NOTE — Progress Notes (Signed)
PCCM PROGRESS NOTE    PT PROFILE: 63 y.o. female smoker suffered prolonged out of hospital cardiac arrest after initially developing acute respiratory distress.  Intubated in ED and TTM protocol implemented.  MAJOR EVENTS/TEST RESULTS: 03/09 Admission via ED after prolonged cardiac arrest 03/09 CT head: No acute abnormalities 03/09 CTA chest: No evidence of significant pulmonary embolus. Consolidation and volume loss in the right upper lung with patchy airspace infiltrates throughout the right lung likely representing pneumonia 03/09 Echo: LVEF 50-55%.  No segmental wall motion abnormalities noted.  No significant valvular abnormalities noted.  RVSP estimated 34 mmHg 03/10 CT head: The sulci are slightly less prominent near the vertex suggesting the possibility of mild edema, given history. 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  MRI performed today and results are documented above.  Vitals:   09/03/18 1203 09/03/18 1300 09/03/18 1500 09/03/18 1600  BP: 140/88 (!) 149/82 127/83 132/88  Pulse: (!) 107 (!) 103 (!) 107 (!) 109  Resp:  _0 Temp:    100 F (37.8 C)  TempSrc:    Oral  SpO2:  93% 94% 95%  Weight:      Height:       Vent Mode: PCV FiO2 (%):  [45 %-50 %] 50 % Set Rate:  [14 bmp-15 bmp] 14 bmp Vt Set:  [450 mL] 450 mL PEEP:  [5 cmH20] 5 cmH20   Intake/Output Summary (Last 24 hours) at 09/03/2018 1753 Last data filed at 09/03/2018 1600 Gross per 24 hour  Intake 1341.97 ml  Output 1225 ml  Net 116.97 ml      EXAM:  Gen: Intubated, synchronous with vent, comatose HEENT: NCAT, sclera white, ETT present Neck: Supple without LAN, thyromegaly, JVD Lungs: Clear anteriorly Cardiovascular: RRR, no murmurs noted Abdomen: Obese, soft, nontender, normal BS Ext: Warm, no edema Neuro: CNs grossly intact, no spontaneous movement, no withdrawal Skin: Limited exam, no lesions noted  DATA:   BMP Latest Ref Rng & Units 09/03/2018 09/02/2018 09/01/2018  Glucose 70 - 99 mg/dL 177(H) 201(H) 147(H)  BUN 6 - 20 mg/dL 27(H) 22(H) 21(H)  Creatinine 0.44 - 1.00 mg/dL 0.59 0.74 0.67  Sodium 135 - 145 mmol/L 144 144 144  Potassium 3.5 - 5.1 mmol/L 4.3 3.8 3.6  Chloride 98 - 111 mmol/L 112(H) 115(H) 117(H)  CO2 22 - 32 mmol/L 27 22 20(L)  Calcium 8.9 - 10.3 mg/dL 8.1(L) 8.0(L) 8.4(L)    CBC Latest Ref Rng & Units 09/03/2018 09/02/2018 09/01/2018  WBC 4.0 - 10.5 K/uL 29.8(H) 23.9(H) 39.5(H)  Hemoglobin 12.0 - 15.0 g/dL 11.8(L) 12.1 13.9  Hematocrit 36.0 - 46.0 % 36.0 36.1 41.9  Platelets 150 - 400 K/uL 141(L) 138(L) 243    CXR:  LLL atx, vs eff persists.  Improved hazy opacity on right  I have personally reviewed all chest radiographs reported above including CXRs and CT chest unless otherwise indicated  IMPRESSION:   S/P prolonged out of hospital cardiac arrest Ventilator dependent respiratory failure Pulmonary infiltrates, improved Minimally elevated troponin I  Type 2 diabetes, controlled on SSI Leukocytosis with elevated PCT Mild thrombocytopenia Severe anoxic encephalopathy  I met with family on 3 occasions today.  We discussed her status and subsequently discussed findings on MRI of brain.  They understand that there is no realistic hope of functional recovery.  They are all in agreement that she would not want to live out her life dependent on machines or in a chronically debilitated condition.  Their plan is for terminal extubation Friday 3/13.   PLAN:  Continue vent support for now. DNR  established in event of recurrent cardiac arrest Plan for terminal extubation 3/13 Full comfort measures in the interim   CCM time: 40 mins  The above time includes time spent in consultation with patient and/or family members and reviewing care plan on multidisciplinary rounds  Merton Border, MD PCCM service Mobile [phone] Pager [phone] 09/03/2018 5:53 PM

## 2018-09-03 NOTE — Progress Notes (Addendum)
Subjective: Patient remains unresponsive and on the ventilator. Has had to receive sedation for increased heart rate and blood pressure.    Objective: Current vital signs: BP 140/88   Pulse (!) 107   Temp 99.5 F (37.5 C) (Esophageal)   Resp 18   Ht '5\' 2"'$  (1.575 m)   Wt 103.6 kg   SpO2 93%   BMI 41.77 kg/m  Vital signs in last 24 hours: Temp:  [98.6 F (37 C)-99.5 F (37.5 C)] 99.5 F (37.5 C) (03/11 1200) Pulse Rate:  [107-131] 107 (03/11 1203) Resp:  [16-25] 18 (03/11 1200) BP: (132-167)/(79-110) 140/88 (03/11 1203) SpO2:  [92 %-95 %] 93 % (03/11 1200) Arterial Line BP: (148-186)/(78-101) 162/86 (03/11 1200) FiO2 (%):  [45 %-50 %] 50 % (03/11 1200) Weight:  [103.6 kg] 103.6 kg (03/11 0500)  Intake/Output from previous day: 03/10 0701 - 03/11 0700 In: 1281.9 [I.V.:767.6; IV Piggyback:514.3] Out: 945 [Urine:845; Emesis/NG output:100] Intake/Output this shift: Total I/O In: 897.2 [I.V.:101.4; NG/GT:552.5; IV Piggyback:243.3] Out: 350 [Urine:350] Nutritional status:  Diet Order    None      Neurologic Exam:  (after Versed administration) Mental Status: Patient does not respond to verbal stimuli.  No response to deep sternal rub. Does not follow commands.  No verbalizations noted.  Cranial Nerves: II: patient does not respond confrontation bilaterally, pupils right 3 mm, left 3 mm,and unreactive bilaterally III,IV,VI: doll's response absent bilaterally. V,VII: corneal reflex absent bilaterally  VIII: patient does not respond to verbal stimuli IX,X: gag reflex reduced, XI: trapezius strength unable to test bilaterally XII: tongue strength unable to test Motor: Extremities flaccid throughout.  No spontaneous movement noted.  No purposeful movements noted. Sensory: Does not respond to noxious stimuli in any extremity.   Lab Results: Basic Metabolic Panel: Recent Labs  Lab 08/28/2018 2357 09/01/18 0411 09/01/18 1703 09/02/18 0318 09/03/18 0519  NA 141 138 144  144 144  K 5.7* 5.2* 3.6 3.8 4.3  CL 108 109 117* 115* 112*  CO2 18* 24 20* 22 27  GLUCOSE 307* 399* 147* 201* 177*  BUN 11 16 21* 22* 27*  CREATININE 1.11* 0.93 0.67 0.74 0.59  CALCIUM 8.7* 8.6* 8.4* 8.0* 8.1*  MG  --  2.2 1.6* 1.5* 1.8  PHOS  --  5.4* 3.2 3.7 3.8    Liver Function Tests: Recent Labs  Lab 09/01/2018 2357 09/03/18 0519  AST 135* 80*  ALT 84* 54*  ALKPHOS 79 33*  BILITOT 0.6 0.8  PROT 6.1* 5.8*  ALBUMIN 3.7 3.3*   No results for input(s): LIPASE, AMYLASE in the last 168 hours. No results for input(s): AMMONIA in the last 168 hours.  CBC: Recent Labs  Lab 08/28/2018 2357 09/01/18 0411 09/02/18 0318 09/03/18 0519  WBC 17.5* 39.5* 23.9* 29.8*  NEUTROABS 6.3  --   --   --   HGB 14.8 13.9 12.1 11.8*  HCT 46.9* 41.9 36.1 36.0  MCV 101.5* 97.9 94.8 98.9  PLT 234 243 138* 141*    Cardiac Enzymes: Recent Labs  Lab 08/30/2018 2357 09/01/18 0411 09/01/18 1115 09/01/18 1703  TROPONINI 0.09* 0.41* 0.45* 0.41*    Lipid Panel: No results for input(s): CHOL, TRIG, HDL, CHOLHDL, VLDL, LDLCALC in the last 168 hours.  CBG: Recent Labs  Lab 09/02/18 2139 09/03/18 0026 09/03/18 0342 09/03/18 0733 09/03/18 1140  GLUCAP 138* 149* 149* 168* 176*    Microbiology: Results for orders placed or performed during the hospital encounter of 08/26/2018  Blood culture (routine x 2)  Status: None (Preliminary result)   Collection Time: 09/01/18  1:54 AM  Result Value Ref Range Status   Specimen Description BLOOD R HAND  Final   Special Requests   Final    BOTTLES DRAWN AEROBIC AND ANAEROBIC Blood Culture adequate volume   Culture   Final    NO GROWTH 2 DAYS Performed at [facility], [address], [place], [place] [address]    Report Status PENDING  Incomplete  Blood culture (routine x 2)     Status: None (Preliminary result)   Collection Time: 09/01/18  2:03 AM  Result Value Ref Range Status   Specimen Description BLOOD L HAND  Final   Special  Requests   Final    BOTTLES DRAWN AEROBIC AND ANAEROBIC Blood Culture adequate volume   Culture   Final    NO GROWTH 2 DAYS Performed at [facility], [address], [place], [place] [address]    Report Status PENDING  Incomplete  Gastrointestinal Panel by PCR , Stool     Status: None   Collection Time: 09/01/18  4:05 AM  Result Value Ref Range Status   Campylobacter species NOT DETECTED NOT DETECTED Final   Plesimonas shigelloides NOT DETECTED NOT DETECTED Final   Salmonella species NOT DETECTED NOT DETECTED Final   Yersinia enterocolitica NOT DETECTED NOT DETECTED Final   Vibrio species NOT DETECTED NOT DETECTED Final   Vibrio cholerae NOT DETECTED NOT DETECTED Final   Enteroaggregative E coli (EAEC) NOT DETECTED NOT DETECTED Final   Enteropathogenic E coli (EPEC) NOT DETECTED NOT DETECTED Final   Enterotoxigenic E coli (ETEC) NOT DETECTED NOT DETECTED Final   Shiga like toxin producing E coli (STEC) NOT DETECTED NOT DETECTED Final   Shigella/Enteroinvasive E coli (EIEC) NOT DETECTED NOT DETECTED Final   Cryptosporidium NOT DETECTED NOT DETECTED Final   Cyclospora cayetanensis NOT DETECTED NOT DETECTED Final   Entamoeba histolytica NOT DETECTED NOT DETECTED Final   Giardia lamblia NOT DETECTED NOT DETECTED Final   Adenovirus F40/41 NOT DETECTED NOT DETECTED Final   Astrovirus NOT DETECTED NOT DETECTED Final   Norovirus GI/GII NOT DETECTED NOT DETECTED Final   Rotavirus A NOT DETECTED NOT DETECTED Final   Sapovirus (I, II, IV, and V) NOT DETECTED NOT DETECTED Final    Comment: Performed at [facility], [address] [place], [place] [address]  C Difficile Quick Screen w PCR reflex     Status: None   Collection Time: 09/01/18  4:05 AM  Result Value Ref Range Status   C Diff antigen NEGATIVE NEGATIVE Final   C Diff toxin NEGATIVE NEGATIVE Final   C Diff interpretation No C. difficile detected.  Final    Comment: Performed at [facility] [address] [place], [place] [address]  MRSA PCR Screening     Status: None   Collection Time: 09/01/18  5:31 AM  Result Value Ref Range Status   MRSA by PCR NEGATIVE NEGATIVE Final    Comment:        The GeneXpert MRSA Assay (FDA approved for NASAL specimens only), is one component of a comprehensive MRSA colonization surveillance program. It is not intended to diagnose MRSA infection nor to guide or monitor treatment for MRSA infections. Performed at [facility], [address] [place], [place] [address]   Culture, respiratory (non-expectorated)     Status: None (Preliminary result)   Collection Time: 09/01/18 11:37 AM  Result Value Ref Range Status   Specimen  Description   Final    TRACHEAL ASPIRATE Performed at [facility], [address] [place], [place] [address]    Special Requests   Final    NONE Performed at [facility], [address] [place], [place] [address]    Gram Stain   Final    FEW WBC PRESENT, PREDOMINANTLY PMN MODERATE GRAM POSITIVE COCCI RARE GRAM POSITIVE RODS    Culture   Final    CULTURE REINCUBATED FOR BETTER GROWTH Performed at [facility], [place] [address], [place], [place] [address]    Report Status PENDING  Incomplete    Coagulation Studies: Recent Labs    09/01/18 0411 09/02/18 0318  LABPROT 15.3* 14.5  INR 1.2 1.1    Imaging: Ct Head Wo Contrast  Result Date: 09/02/2018 CLINICAL DATA:  Anoxic brain injury. EXAM: CT HEAD WITHOUT CONTRAST TECHNIQUE: Contiguous axial images were obtained from the base of the skull through the vertex without intravenous contrast. COMPARISON:  September 01, 2018 FINDINGS: Brain: No subdural, epidural, or subarachnoid hemorrhage. Ventricles are stable. The sulci near the vertex are slightly less prominent. Cerebellum, brainstem, and basal cisterns are stable. No midline shift. No acute cortical ischemia or infarct identified. Vascular: No hyperdense vessel or unexpected  calcification. Skull: Normal. Negative for fracture or focal lesion. Sinuses/Orbits: No acute finding. Other: None. IMPRESSION: 1. The sulci are slightly less prominent near the vertex suggesting the possibility of mild edema, given history. However, the ventricles and basal cisterns are stable and patent. No other change or acute abnormality noted. Electronically Signed   By: Dorise Bullion III M.D   On: 09/02/2018 12:03   Mr Brain Wo Contrast  Result Date: 09/03/2018 CLINICAL DATA:  Cardiac arrest.  Cerebral anoxia. EXAM: MRI HEAD WITHOUT CONTRAST TECHNIQUE: Multiplanar, multiecho pulse sequences of the brain and surrounding structures were obtained without intravenous contrast. COMPARISON:  CT head 09/02/2018 FINDINGS: Widespread and severe cerebral edema. There is restricted diffusion throughout the cerebral cortex bilaterally. There is also restricted diffusion in the basal ganglia bilaterally and in the cerebellum bilaterally. This is symmetric and widespread and compatible with severe anoxic injury. Ventricle size normal. No hemorrhage mass or fluid collection. No midline shift. Normal arterial flow voids. Mucosal edema paranasal sinuses and mastoid sinus bilaterally. 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 These results were called by telephone at the time of interpretation on 09/03/2018 at 2:50 pm to Dr. Alexis Goodell , who verbally acknowledged these results. Electronically Signed   By: Franchot Gallo M.D.   On: 09/03/2018 14:50   Dg Chest Port 1 View  Result Date: 09/03/2018 CLINICAL DATA:  Respiratory failure EXAM: PORTABLE CHEST 1 VIEW COMPARISON:  Yesterday FINDINGS: Endotracheal tube tip is nearly 2 cm above the carina. The orogastric tube at least reaches the stomach. Right IJ line with tip at the distal SVC. Low volume chest with cardiomegaly and vascular pedicle widening. Unchanged haziness of the left lower chest, likely  atelectasis based on chest CT from 2 days ago. Improved right lung aeration. IMPRESSION: 1. Stable hardware positioning. 2. Improved right-sided aeration. Electronically Signed   By: Monte Fantasia M.D.   On: 09/03/2018 06:26   Dg Chest Port 1 View  Result Date: 09/02/2018 CLINICAL DATA:  Respiratory failure. EXAM: PORTABLE CHEST 1 VIEW COMPARISON:  Chest radiograph September 01, 2018 FINDINGS: Endotracheal tube tip projects 17 mm above the carina. RIGHT internal jugular central venous catheter distal tip projects in distal superior  vena cava. Nasogastric tube past proximal stomach, distal tip out of field of view. Cardiac silhouette is normal in size. Mediastinal silhouette is unremarkable. Retrocardiac consolidation. Increased interstitial prominence. Blunting of the LEFT costophrenic angle. No pneumothorax. Soft tissue planes and included osseous structures are unchanged. IMPRESSION: 1. No apparent change of life support lines. 2. New retrocardiac consolidation and increasing interstitial densities with small suspected LEFT pleural effusion. Electronically Signed   By: Elon Alas M.D.   On: 09/02/2018 04:58   Dg Abd Portable 1v  Result Date: 09/03/2018 CLINICAL DATA:  Orogastric tube placement EXAM: PORTABLE ABDOMEN - 1 VIEW COMPARISON:  09/01/2018 FINDINGS: Image quality degraded by motion. Overlying EKG leads and defibrillator pad. NG tube not visualized in the distal esophagus or stomach. No dilated bowel loops IMPRESSION: Image quality degraded by motion and overlying apparatus. NG tube not visualized. Electronically Signed   By: Franchot Gallo M.D.   On: 09/03/2018 14:41    Medications:  I have reviewed the patient's current medications. Scheduled: . budesonide (PULMICORT) nebulizer solution  0.5 mg Nebulization BID  . chlorhexidine gluconate (MEDLINE KIT)  15 mL Mouth Rinse BID  . fentaNYL (SUBLIMAZE) injection  50 mcg Intravenous Once  . free water  100 mL Per Tube Q8H  . heparin  injection (subcutaneous)  5,000 Units Subcutaneous Q8H  . insulin aspart  0-15 Units Subcutaneous Q4H  . ipratropium-albuterol  3 mL Nebulization Q6H  . labetalol  100 mg Per Tube BID  . mouth rinse  15 mL Mouth Rinse 10 times per day  . multivitamin  15 mL Per Tube Daily  . sodium chloride flush  10-40 mL Intracatheter Q12H    Assessment/Plan: Patient remains unresponsive.  Exam today after Versed administration so actually less responses noted than on examination yesterday. MRI of the brain reviewed and shows evidence of diffuse anoxic injury.  Prognosis for functional recovery is extremely poor.   Recommend withdrawal of care with comfort care measures only.    Family not available for discussion at this time.      LOS: 2 days   Alexis Goodell, MD Neurology [phone] 09/03/2018  3:10 PM

## 2018-09-04 ENCOUNTER — Other Ambulatory Visit: Payer: Self-pay

## 2018-09-04 LAB — CULTURE, RESPIRATORY W GRAM STAIN: Culture: NORMAL

## 2018-09-04 MED ORDER — MORPHINE SULFATE (PF) 2 MG/ML IV SOLN
INTRAVENOUS | Status: AC
  Filled 2018-09-04: qty 1

## 2018-09-04 MED ORDER — LORAZEPAM 2 MG/ML IJ SOLN
INTRAMUSCULAR | Status: AC
  Filled 2018-09-04: qty 1

## 2018-09-04 MED ORDER — ACETAMINOPHEN 10 MG/ML IV SOLN
1000.0000 mg | Freq: Once | INTRAVENOUS | Status: AC
  Administered 2018-09-04: 1000 mg via INTRAVENOUS
  Filled 2018-09-04: qty 100

## 2018-09-04 MED ORDER — LORAZEPAM 2 MG/ML IJ SOLN
2.0000 mg | Freq: Once | INTRAMUSCULAR | Status: AC
  Administered 2018-09-04: 2 mg via INTRAVENOUS

## 2018-09-04 MED ORDER — MIDAZOLAM HCL 2 MG/2ML IJ SOLN
2.0000 mg | INTRAMUSCULAR | Status: DC | PRN
  Administered 2018-09-04 – 2018-09-05 (×3): 4 mg via INTRAVENOUS
  Filled 2018-09-04 (×4): qty 4

## 2018-09-04 MED ORDER — ACETAMINOPHEN 650 MG RE SUPP
650.0000 mg | RECTAL | Status: DC | PRN
  Administered 2018-09-04: 650 mg via RECTAL
  Filled 2018-09-04: qty 1

## 2018-09-04 NOTE — Progress Notes (Signed)
PCCM PROGRESS NOTE    PT PROFILE: 63 y.o. female smoker suffered prolonged out of hospital cardiac arrest after initially developing acute respiratory distress.  Intubated in ED and TTM protocol implemented.  MAJOR EVENTS/TEST RESULTS: 03/09 Admission via ED after prolonged cardiac arrest 03/09 CT head: No acute abnormalities 03/09 CTA chest: No evidence of significant pulmonary embolus. Consolidation and volume loss in the right upper lung with patchy airspace infiltrates throughout the right lung likely representing pneumonia 03/09 Echo: LVEF 50-55%.  No segmental wall motion abnormalities noted.  No significant valvular abnormalities noted.  RVSP estimated 34 mmHg 03/10 CT head: The sulci are slightly less prominent near the vertex suggesting the possibility of mild edema, given history. 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  No evidence of discomfort.  Vitals:   09/04/18 1400 09/04/18 1500 09/04/18 1600 09/04/18 1641  BP: (!) 150/74 (!) 156/81    Pulse: (!) 120 (!) 125 (!) 125   Resp: (!) 24 (!) 25 (!) 25   Temp: (!) 101.7 F (38.7 C) (!) 102 F (38.9 C) (!) 102 F (38.9 C)   TempSrc:   Rectal   SpO2: 97% 96% 97% 97%  Weight:      Height:       Vent Mode: PCV FiO2 (%):  [50 %] 50 % Set Rate:  [14 bmp] 14 bmp Vt Set:  [12 mL] 12 mL PEEP:  [5 cmH20] 5 cmH20   Intake/Output Summary (Last 24 hours) at 09/04/2018 1716 Last data filed at 09/04/2018 1600 Gross per 24 hour  Intake 320.47 ml  Output 1175 ml  Net -854.53 ml      EXAM:  Gen: Intubated, synchronous with vent, comatose HEENT: NCAT, sclera white, ETT present Neck: Supple without LAN, thyromegaly, JVD Lungs: Clear anteriorly Cardiovascular: RRR, no murmurs noted Abdomen: Obese, soft, nontender, normal BS Ext: Warm, no edema Neuro: CNs grossly intact, no spontaneous movement, no withdrawal Skin: Limited exam, no lesions noted  DATA:   BMP Latest Ref Rng & Units 09/03/2018 09/02/2018 09/01/2018  Glucose 70 - 99 mg/dL 340(Z) 709(U) 438(V)  BUN 6 - 20 mg/dL 81(M) 40(R) 75(O)  Creatinine 0.44 - 1.00 mg/dL 3.60 6.77 0.34  Sodium 135 - 145 mmol/L 144 144 144  Potassium 3.5 - 5.1 mmol/L 4.3 3.8 3.6  Chloride 98 - 111 mmol/L 112(H) 115(H) 117(H)  CO2 22 - 32 mmol/L 27 22 20(L)  Calcium 8.9 - 10.3 mg/dL 8.1(L) 8.0(L) 8.4(L)    CBC Latest Ref Rng & Units 09/03/2018 09/02/2018 09/01/2018  WBC 4.0 - 10.5 K/uL 29.8(H) 23.9(H) 39.5(H)  Hemoglobin 12.0 - 15.0 g/dL 11.8(L) 12.1 13.9  Hematocrit 36.0 - 46.0 % 36.0 36.1 41.9  Platelets 150 - 400 K/uL 141(L) 138(L) 243    CXR:  LLL atx, vs eff persists.  Improved hazy opacity on right  I have personally reviewed all chest radiographs reported above including CXRs and CT chest unless otherwise indicated  IMPRESSION:   S/P prolonged out of hospital cardiac arrest Ventilator dependent respiratory failure Pulmonary  infiltrates, improved Minimally elevated troponin I Type 2 diabetes, controlled on SSI Leukocytosis with elevated PCT Mild thrombocytopenia Severe anoxic encephalopathy   PLAN:  Family plans on terminal extubation 3/13.  Continue comfort measures until then.   CCM time:  The above time includes time spent in consultation with patient and/or family members and reviewing care plan on multidisciplinary rounds  Billy Fischer, MD PCCM service Mobile [phone] Pager [phone] 09/04/2018 5:16 PM

## 2018-09-04 NOTE — Progress Notes (Signed)
[facility] - [place] at [facility]   PATIENT NAME: Deiondra Wohler    MR#:  [id number]  DATE OF BIRTH:  06/19/1956  SUBJECTIVE:  CHIEF COMPLAINT:   Chief Complaint  Patient presents with  . Respiratory Arrest   Patient admitted to the ICU following cardiac arrest.  Currently on the vent.sedation was previously discontinued.  Notified about plans for family to extubate patient in a.m. with goal of withdrawing care.  Patient currently on morphine drip   REVIEW OF SYSTEMS:  ROS Unobtainable due to patient being on the vent. DRUG ALLERGIES:   Allergies  Allergen Reactions  . Clindamycin Hives and Swelling  . Macrolides And Ketolides Anaphylaxis  . Erythromycin   . Penicillins Hives and Swelling   VITALS:  Blood pressure (!) 154/78, pulse (!) 139, temperature (!) 101.3 F (38.5 C), temperature source Esophageal, resp. rate (!) 24, height 5\' 2"  (1.575 m), weight 103.6 kg, SpO2 96 %. PHYSICAL EXAMINATION:  Physical Exam  Constitutional: She appears well-developed.  Patient on the vent  HENT:  Head: Normocephalic.  Mouth/Throat: Oropharynx is clear and moist.  Eyes: Pupils are equal, round, and reactive to light. Conjunctivae are normal. Right eye exhibits no discharge.  Neck: Neck supple. No tracheal deviation present.  Cardiovascular: Normal rate, regular rhythm and normal heart sounds.  Respiratory: No stridor. No respiratory distress.  Patient on the vent  GI: Soft. Bowel sounds are normal. She exhibits no distension.  Musculoskeletal:        General: No deformity or edema.  Neurological:   Patient on vent and not requiring any sedation.  Neuro examination deferred  Skin: Skin is warm. No erythema.  Psychiatric:  patienton the vent.  Psych evaluation deferred   LABORATORY PANEL:  Female CBC Recent Labs  Lab 09/03/18 0519  WBC 29.8*  HGB 11.8*  HCT 36.0  PLT 141*    ------------------------------------------------------------------------------------------------------------------ Chemistries  Recent Labs  Lab 09/03/18 0519  NA 144  K 4.3  CL 112*  CO2 27  GLUCOSE 177*  BUN 27*  CREATININE 0.59  CALCIUM 8.1*  MG 1.8  AST 80*  ALT 54*  ALKPHOS 33*  BILITOT 0.8   RADIOLOGY:  No results found. ASSESSMENT AND PLAN:     1. Cardiac arrest [facility]) -unclear etiology upfront.  Patient had out of hospital prolonged cardiopulmonary arrest.  Exact etiology not clear.  Given sudden onset arrhythmia cannot be excluded.  Hypothermia protocol completed. Seen by cardiologist.  2D echocardiogram done revealed ejection fraction of 50 to 55%.  Cavity size was normal.   Minimally elevated troponin likely related to prolonged CPR.  No evidence of acute coronary syndrome.  Heparin drip was discontinued. Being managed by critical care team and cardiology service.  2.  Myoclonus following cardiac arrest. Patient had a repeat CT head done on 09/02/2018 which revealed the sulci are slightly less prominent near the vertex suggesting the possibility of mild edema, given history. However, the ventricles and basal cisterns are stable and patent. No other change or acute abnormality noted EEG done reviewed an abnormal EEG secondary to very low voltage general background slowing.  This finding may be seen with a diffuse disturbance that is etiologically nonspecific, but may include a metabolic encephalopathy, among other possibilities.  No epileptiform activity was noted Patient had MRI of the brain done yesterday which revealed severe anoxic injury throughout both cerebral hemispheres, basal ganglia, and cerebellum. Critical care physician discussed extensively with family members.  The plan is for terminal  extubation tomorrow   3.  Acute respiratory failure with hypoxia and hypercapnia Being managed by critical care team.   4.  Septic shock secondary to right upper lobe  pneumonia Patient on the vent.  Off Levophed drip.  Was previously on IV vancomycin and cefepime.  Antibiotics noted to have been discontinued.  Patient currently on morphine drip. Plans for terminal extubation tomorrow.   5.  COPD exacerbation.  Off Solu-Medrol.  Nebulizer treatments.  Empiric antibiotics.   Vent management by pulmonary and critical care physician  6.  Diabetes mellitus type 2 Monitor blood sugars and adjust regimen as needed.  DVT prophylaxis; heparin  All the records are reviewed and case discussed with Care Management/Social Worker. Management plans discussed with the patient, family and they are in agreement.  CODE STATUS: DNR  TOTAL TIME TAKING CARE OF THIS PATIENT: 37 minutes.   More than 50% of the time was spent in counseling/coordination of care: YES  POSSIBLE D/C IN 2 DAYS, DEPENDING ON CLINICAL CONDITION.   Lanah Steines M.D on 09/04/2018 at 3:56 PM  Between 7am to 6pm - Pager - [phone]  After 6pm go to [email or web address] - Social research officer, government  [facility] [place] [facility]  Office  [phone]  CC: Primary care physician; System, Pcp Not In  Note: This dictation was prepared with Dragon dictation along with smaller phrase technology. Any transcriptional errors that result from this process are unintentional.

## 2018-09-04 NOTE — Progress Notes (Signed)
Pt remains febrile but temp has improved from 102 to 99.3 with cool bath, cotton gown/linens, and application of cooling blanket.

## 2018-09-04 NOTE — Progress Notes (Signed)
Nutrition Brief Follow-Up Note  Chart reviewed. Patient has transitioned to comfort care.   No further nutrition interventions warranted at this time. Please re-consult RD as needed.   Theador Jezewski, MS, RD, LDN Office: [phone] Pager: [phone] After Hours/Weekend Pager: [phone]  

## 2018-09-05 MED ORDER — VECURONIUM BROMIDE 10 MG IV SOLR
10.0000 mg | Freq: Once | INTRAVENOUS | Status: AC
  Administered 2018-09-05: 10 mg via INTRAVENOUS

## 2018-09-05 MED ORDER — LORAZEPAM 2 MG/ML IJ SOLN
2.0000 mg | Freq: Once | INTRAMUSCULAR | Status: AC
  Administered 2018-09-05: 2 mg via INTRAVENOUS

## 2018-09-05 MED ORDER — LEVETIRACETAM IN NACL 500 MG/100ML IV SOLN
500.0000 mg | Freq: Two times a day (BID) | INTRAVENOUS | Status: DC
  Administered 2018-09-05: 500 mg via INTRAVENOUS
  Filled 2018-09-05 (×3): qty 100

## 2018-09-05 MED ORDER — LORAZEPAM 2 MG/ML IJ SOLN
INTRAMUSCULAR | Status: AC
  Filled 2018-09-05: qty 1

## 2018-09-05 MED ORDER — LORAZEPAM 2 MG/ML IJ SOLN
INTRAMUSCULAR | Status: AC
  Filled 2018-09-05: qty 2

## 2018-09-06 LAB — CULTURE, BLOOD (ROUTINE X 2)
Culture: NO GROWTH
Culture: NO GROWTH
SPECIAL REQUESTS: ADEQUATE
Special Requests: ADEQUATE

## 2018-09-09 ENCOUNTER — Telehealth: Payer: Self-pay

## 2018-09-09 NOTE — Telephone Encounter (Signed)
Death certificate received, placed in DS box for completion.  

## 2018-09-10 NOTE — Telephone Encounter (Signed)
Death Certificate completed. [employer] aware ready for pickup.

## 2018-09-24 NOTE — Progress Notes (Signed)
Pt continues to struggle with full body myoclonus despite morphine and versed.  Vecuronium 10mg  IVP x1 ordered.

## 2018-09-24 NOTE — Progress Notes (Signed)
Patient previously displaying signs of Cushing's triad (bradycardia, low tidal volumes on the vent, severe hypertension).  Updated patient's husband that it appears his wife is declining and in the dying process, of which he elected to call the rest of the family to come to bedside.  Once all family present, the patient's husband and family were in agreement to terminally extubate the patient and proceed with comfort measures only.  Patient terminally extubated at 06:28.  Support offered to family, chaplain at bedside. DNR/DNI.   Harlon Ditty, AGACNP-BC [facility] Pager: [phone] Cell: [phone]

## 2018-09-24 NOTE — Discharge Summary (Signed)
DEATH SUMMARY  DATE OF ADMISSION:    DATE OF DISCHARGE/DEATH:    ADMISSION DIAGNOSES:   Out of hospital cardiac arrest Ventilator dependent respiratory failure Suspected pneumonia COPD exacerbation Type 2 diabetes History of hypertension   DISCHARGE DIAGNOSES:   Out of hospital cardiac arrest Ventilator dependent respiratory failure Suspected pneumonia COPD exacerbation Pulmonary infiltrates, improved Type 2 diabetes History of hypertension Mildly elevated troponin I Leukocytosis Mild thrombocytopenia Severe anoxic encephalopathy   PRESENTATION:   Pt was admitted to the ICU by the Hospitalist Service with the following HPI and the above admission diagnoses:  Suzanne Rocha  is a 63 y.o. female who presents with chief complaint as above.  Presented to the ED post cardiac arrest.  Family reports that she had been well today, her usual self, until this evening when she experienced acute onset shortness of breath.  She called in respiratory distress for help.  EMS was called and when they arrived patient became unresponsive and had asystole arrest.  She had 2 rounds of CPR with 2 doses of epi given in route to the ED and achieved ROSC.  On arrival here she had her [facility] airway exchanged for ET tube.  ED physician reports an episode of vomiting and concerns for possible aspiration.  Her blood pressure in the ED has been low requiring vasopressors.  She was given IV antibiotics, and IV Solu-Medrol.  Patient does have a history of COPD, with significant persistent wheezing.  Her blood gas was significantly hypercarbic.  Hospitalist were called for admission  HOSPITAL COURSE:   2022-09-14 Admission via ED after prolonged cardiac arrest 09/14/2022 CT head: No acute abnormalities 09/14/2022 CTA chest: No evidence of significant pulmonary embolus. Consolidation and volume loss in the right upper lung with patchy airspace infiltrates throughout the right lung likely representing pneumonia September 14, 2022 Echo: LVEF  50-55%.  No segmental wall motion abnormalities noted.  No significant valvular abnormalities noted.  RVSP estimated 34 mmHg 03/10 CT head: The sulci are slightly less prominent near the vertex suggesting the possibility of mild edema, given history. 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, basal ganglia, and cerebellum 2022/09/16 Family apprised of MRI findings and opted for DNR with plan for terminal extubation 03/13 03/13 Terminal extubation  INDWELLING DEVICES:: ETT 09/14/22 >> 03/13 R IJ CVL 14-Sep-2022 R femoral A line  MICRO DATA: MRSA PCR 2022-09-14 >> NEG  Resp 09-14-22 >> Normal respiratory flora Blood 09-14-22 >> NEG  ANTIMICROBIALS:  Vanc 09-14-2022 >> Sep 14, 2022 Metronidazole 09/14/2022 >> 09-14-22 Cefepime 2022-09-14 >> 16-Sep-2022  Cause of death:  Severe anoxic encephalopathy due to prolonged cardiorespiratory arrest  Contributing factors: COPD exacerbation DM 2   Autopsy: No  Smoking: Yes  Billy Fischer, MD PCCM service Mobile [phone] Pager [phone] 09/06/2018 10:46 AM

## 2018-09-24 NOTE — Progress Notes (Signed)
   01-Oct-2018 0700  Clinical Encounter Type  Visited With Family;Health care provider  Visit Type Follow-up;Psychological support  Spiritual Encounters  Spiritual Needs Emotional;Grief support  Stress Factors  Family Stress Factors Loss;Major life changes   Chaplain called to be with the patient's family in the wake of difficult decisions and her declining status. Many family members were present (including the patient's husband, siblings and daughter). As the nurse was sharing and update, the patient's daughter asked to talk with her because "she may not need to do what she was preparing to do next." The patient's daughter then shared that they were now ready to withdraw life support rather than wait until later this morning. The family was at the bedside and appears to be a very close-knit, supportive, insular group. Chaplain offered support in the form of compassionate presence and silent prayer as the patient was extubated. They wept and held each other as she took her last breaths. No additional needs are obvious at this time as the family seemed to desire time to grieve the loss of their loved together. The patient's nurse will call or page if needs arise.

## 2018-09-24 NOTE — Death Summary Note (Signed)
[facility] - [place] at [facility]    Death Note  Chief complaint; cardiac arrest  History of presenting complaint Suzanne Rocha  is a 63 y.o. female who presented to the ED post cardiac arrest.  Family reported that she had been well on the day of presentation, her usual self, until this evening when she experienced acute onset shortness of breath.  She called in respiratory distress for help.  EMS was called and when they arrived patient became unresponsive and had asystole arrest.  She had 2 rounds of CPR with 2 doses of epi given in route to the ED and achieved ROSC.  On arrival here she had her [facility] airway exchanged for ET tube.  ED physician reports an episode of vomiting and concerns for possible aspiration.  Her blood pressure in the ED has been low requiring vasopressors.  She was given IV antibiotics, and IV Solu-Medrol.  Patient does have a history of COPD, with significant persistent wheezing.  Her blood gas was significantly hypercarbic.  Hospitalist were called for admission   Hospital course; 1. Cardiac arrest [facility]) -unclear etiology upfront.  Patient had out of hospital prolonged cardiopulmonary arrest.  Exact etiology not clear.  Given sudden onset arrhythmia cannot be excluded.  Hypothermia protocol completed. Seen by cardiologist.  2D echocardiogram done revealed ejection fraction of 50 to 55%.  Cavity size was normal.   Minimally elevated troponin likely related to prolonged CPR.  No evidence of acute coronary syndrome.  Heparin drip was discontinued.  Patient was being managed by critical care team and cardiology service.  Patient had MRI of the brain done which revealed severe anoxic injury throughout both cerebral hemispheres, basal ganglia, and cerebellum.Critical care physician discussed extensively with family members.  The plan is for terminal extubation today.  Patient was extubated this morning prior to my arrival and subsequently died at 6:43 AM.   Family members present at bedside.   2.  Myoclonus following cardiac arrest. Patient had a repeat CT head done on 09/02/2018 which revealed the sulci are slightly less prominent near the vertex suggesting the possibility of mild edema, given history. However, the ventricles and basal cisterns are stable and patent. No other change or acute abnormality noted EEG done reviewed an abnormal EEG secondary tovery low voltagegeneral background slowing. This finding may be seen with a diffuse disturbance that is etiologically nonspecific, but may include a metabolic encephalopathy, among other possibilities. No epileptiform activity was noted  3.Acute respiratory failure with hypoxia and hypercapnia Patient was being managed by critical care team.  Patient died on 09-17-18 at 6:43 AM  4.  Septic shock secondary to right upper lobe pneumonia Patient on the vent.  Off Levophed drip.  Was previously on IV vancomycin and cefepime.  Antibiotics noted to have been discontinued.  Patient was later placed on morphine drip during this admission for comfort prior to death   5.  COPD exacerbation.  Off Solu-Medrol.  Nebulizer treatments.  Empiric antibiotics.   Vent management by pulmonary and critical care physician prior to death     Suzanne Rocha DPO:[id number],ERX:[id number] is a 63 y.o. female, Outpatient Primary MD for the patient is System, Pcp Not In  Pronounced dead by 17-Sep-2018 on      @      6:43 AM             Cause of death ; cardiac arrest   [name] M.D on 09-17-18 at 12:31 PM  [facility] -  [place] at [facility]    OFFICE [phone]  Total clinical and documentation time for today Under 30 minutes   Last Note

## 2018-09-24 NOTE — Progress Notes (Signed)
Report received from night shift RN, Shauna. Family remains at patient's bedside. Emotion support provided to family. Once family is finished with visitation, body will be prepared and transferred to morgue.

## 2018-09-24 NOTE — Progress Notes (Signed)
Pt with constant myoclonus activity.  Tamala Fothergill, NP.  Keppra infusing.  Morphine, ativan, and versed administered.

## 2018-09-24 NOTE — Progress Notes (Signed)
PT extubated per family request at 0620 hrs

## 2018-09-24 NOTE — Progress Notes (Signed)
Pt not tolerating PCV, MVE was dropping and stayed low regardless of adjustments made. Switched to [facility] 450x14 .50 + 5 at this time.

## 2018-09-24 NOTE — Progress Notes (Addendum)
   09/11/2018 0500  Vitals  BP (!) 194/78  MAP (mmHg) 112  BP Location Left Arm  BP Method Automatic  Patient Position (if appropriate) Lying  Pulse Rate (!) 41  Pulse Rate Source Monitor  ECG Heart Rate (!) 34  Cardiac Rhythm SB  Ectopy Unifocal PVC's;Sinus pause  Resp 16  Oxygen Therapy  SpO2 (!) 82 %  O2 Device Ventilator  FiO2 (%) 50 %  End Tidal CO2 (EtCO2) 35   Pt noted to have symptoms of Cushing's Triad. Marland Kitchen  HR from 30s to 70.   Sats from 80 to 94%.

## 2018-09-24 NOTE — Progress Notes (Addendum)
Pt changed back to previous settings PCV 10/5 x15 .50 +5

## 2018-09-24 NOTE — Progress Notes (Signed)
All LDAs removed except for Flexi-Seal. Body prep complete. Ready for transfer to morgue.   30 mL of IV morphine wasted with Adron Bene, RN as my witness.

## 2018-09-24 DEATH — deceased

## 2019-07-14 IMAGING — MR MRI HEAD WITHOUT CONTRAST
12 series · 48 of 48 positions shown · non-contrast
Comparison: CT head 09/02/2018

CLINICAL DATA: Cardiac arrest.  Cerebral anoxia.

EXAM:
MRI HEAD WITHOUT CONTRAST
TECHNIQUE: Multiplanar, multiecho pulse sequences of the brain and surrounding
structures were obtained without intravenous contrast.

[Series 2: ax dwi_tracew · axial · 3.0mm · 0.83mm/px · z∈[-59,+102]mm · 4 of 55 slices shown]
[im 1/55]
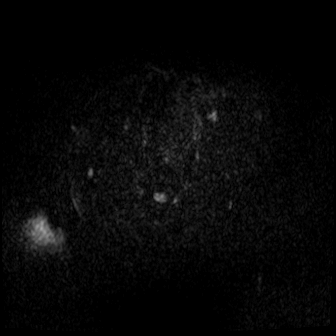
[im 19/55]
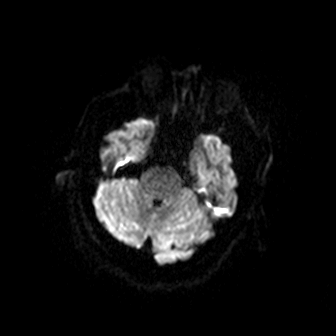
[im 37/55]
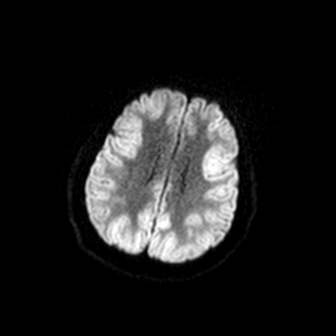
[im 55/55]
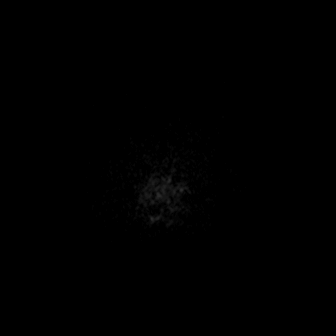

[Series 3: ax dwi_adc · axial · 3.0mm · 0.83mm/px · z∈[-59,+102]mm · 5 of 55 slices shown]
[im 1/55]
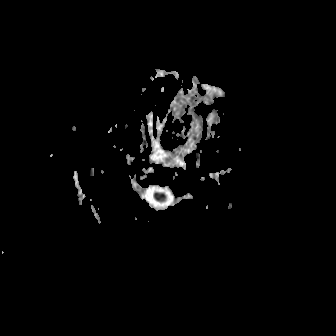
[im 14/55]
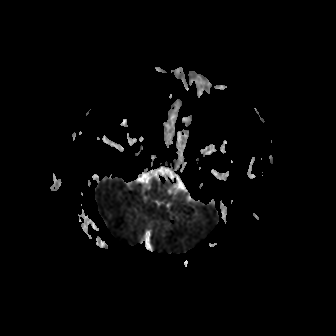
[im 28/55]
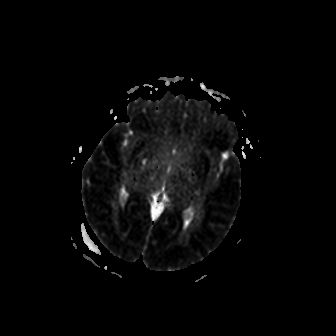
[im 41/55]
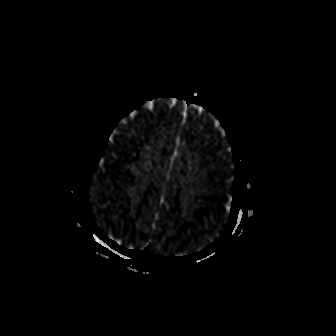
[im 55/55]
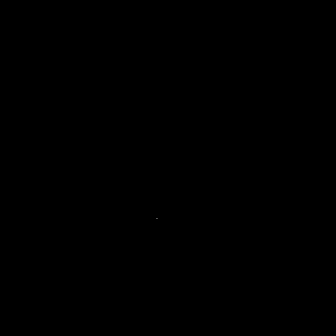

[Series 4: cor dwi_tracew · coronal · 5.0mm · 0.68mm/px · 4 of 45 slices shown]
[im 1/45]
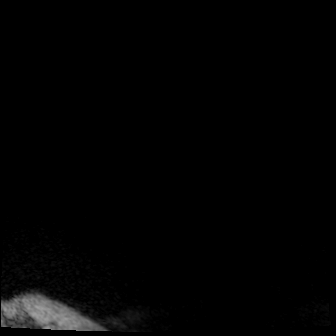
[im 15/45]
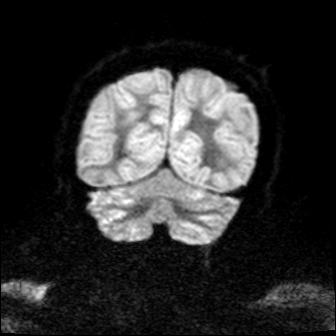
[im 30/45]
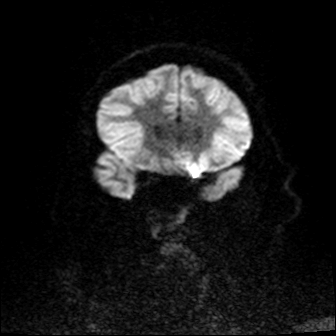
[im 45/45]
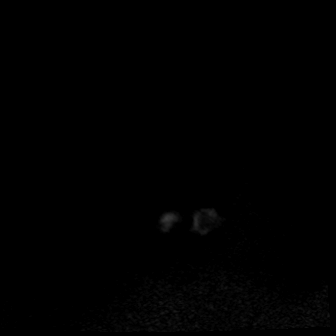

[Series 5: cor dwi_adc · coronal · 5.0mm · 0.68mm/px · 4 of 45 slices shown]
[im 1/45]
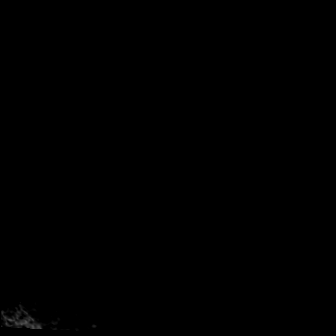
[im 15/45]
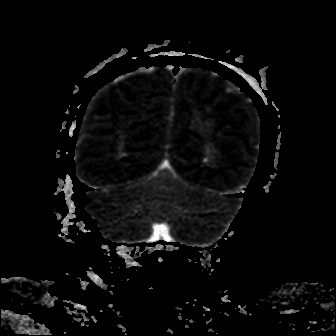
[im 30/45]
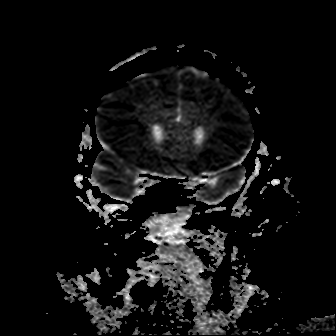
[im 45/45]
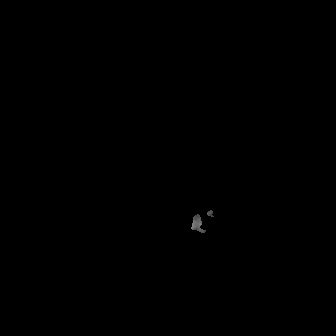

[Series 6: T1 · sagittal · 5.0mm · 0.94mm/px · 2 of 25 slices shown (1 of 2)]
[im 1/25]
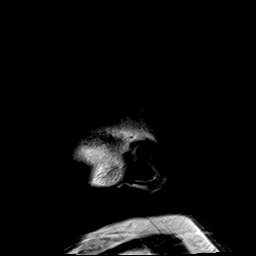
[im 25/25]
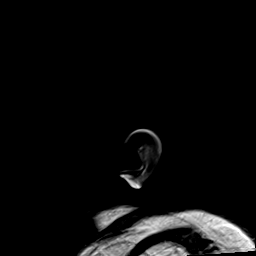

[Series 7: T2 · axial · 5.0mm · 0.45mm/px · z∈[-58,+97]mm · 3 of 27 slices shown (1 of 2)]
[im 1/27]
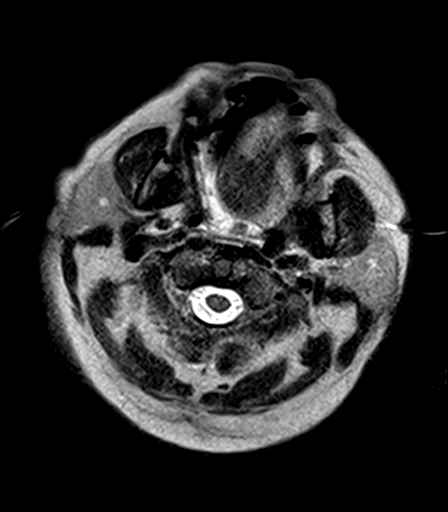
[im 14/27]
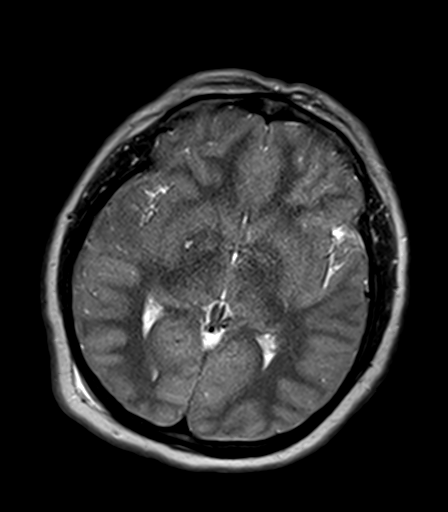
[im 27/27]
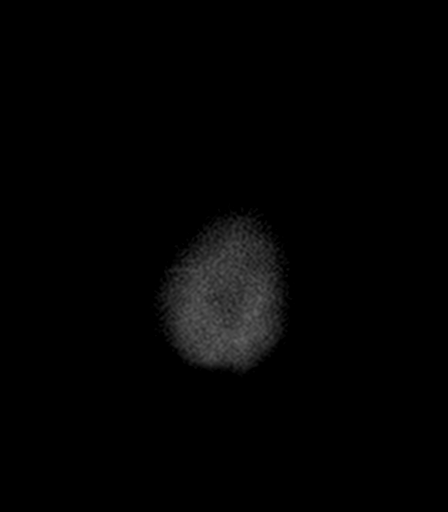

[Series 8: FLAIR · axial · 5.0mm · 1.20mm/px · z∈[-58,+97]mm · 3 of 27 slices shown]
[im 1/27]
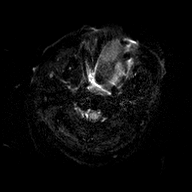
[im 14/27]
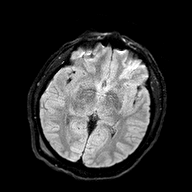
[im 27/27]
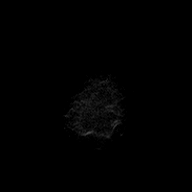

[Series 9: mag_images · axial · 3.0mm · 0.90mm/px · z∈[-69,+107]mm · 6 of 60 slices shown]
[im 1/60]
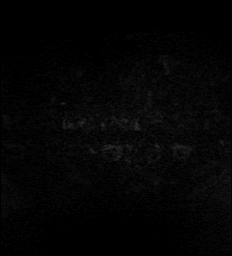
[im 12/60]
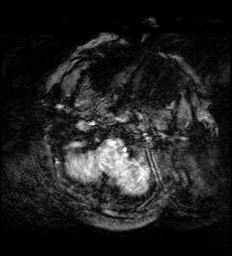
[im 24/60]
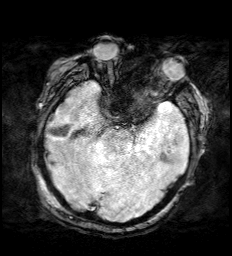
[im 36/60]
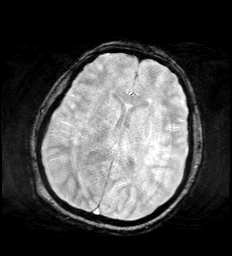
[im 48/60]
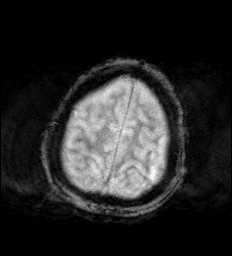
[im 60/60]
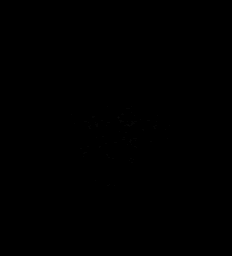

[Series 10: pha_images · axial · 3.0mm · 0.90mm/px · z∈[-63,+104]mm · 5 of 56 slices shown]
[im 1/56]
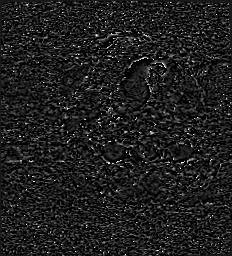
[im 14/56]
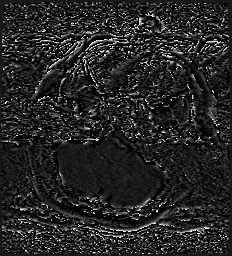
[im 28/56]
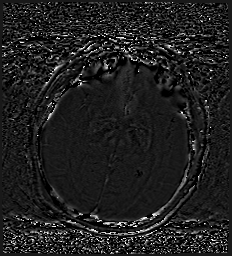
[im 42/56]
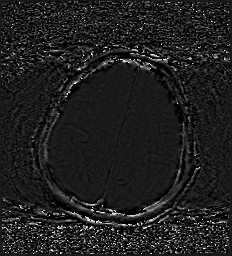
[im 56/56]
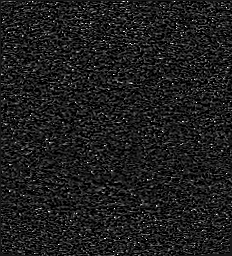

[Series 11: swi_images · axial · 3.0mm · 0.90mm/px · z∈[-69,+107]mm · 6 of 60 slices shown]
[im 1/60]
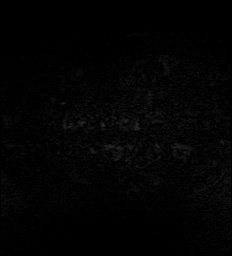
[im 12/60]
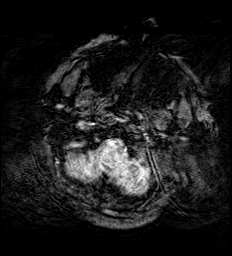
[im 24/60]
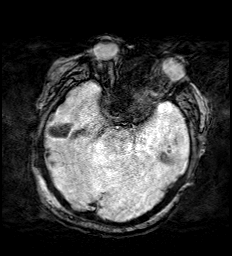
[im 36/60]
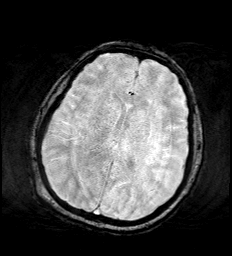
[im 48/60]
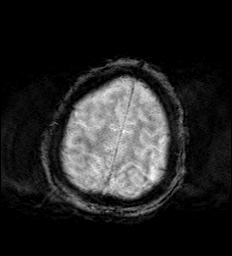
[im 60/60]
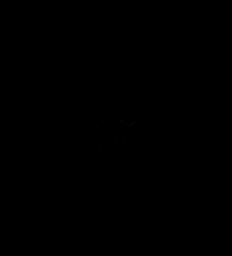

[Series 13: T1 · axial · 5.0mm · 0.90mm/px · z∈[-58,+97]mm · 3 of 27 slices shown (2 of 2)]
[im 1/27]
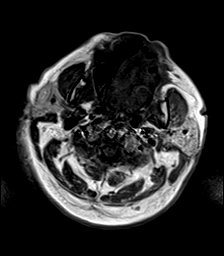
[im 14/27]
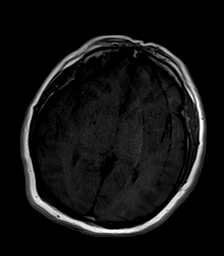
[im 27/27]
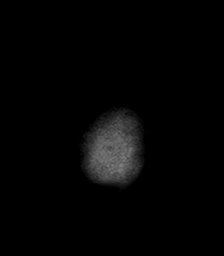

[Series 14: T2 · coronal · 5.0mm · 0.45mm/px · 3 of 31 slices shown (2 of 2)]
[im 1/31]
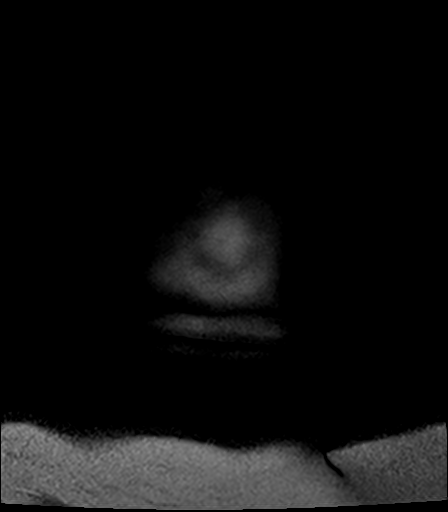
[im 16/31]
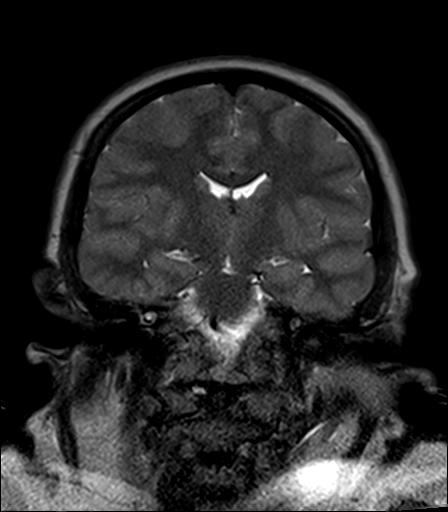
[im 31/31]
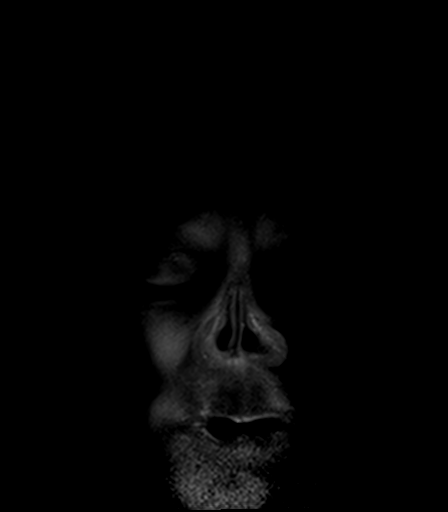

[48 of 48 positions shown; findings below may reference images not displayed]

FINDINGS: Widespread and severe cerebral edema. There is restricted diffusion
throughout the cerebral cortex bilaterally. There is also restricted
diffusion in the basal ganglia bilaterally and in the cerebellum
bilaterally. This is symmetric and widespread and compatible with
severe anoxic injury. Ventricle size normal. No hemorrhage mass or
fluid collection. No midline shift.

Normal arterial flow voids.

Mucosal edema paranasal sinuses and mastoid sinus bilaterally.
IMPRESSION: Severe anoxic injury throughout both cerebral hemispheres, basal
ganglia, and cerebellum.

These results were called by telephone at the time of interpretation
on 09/03/2018 at [DATE] to Dr. SAVIO LOCKLEAR , who verbally
acknowledged these results.
